# Patient Record
Sex: Male | Born: 2000 | Race: White | Hispanic: No | Marital: Single | Smoking: Never smoker
Health system: Southern US, Community
[De-identification: ages and names within clinical notes are randomized; demographics above are authoritative.]

## PROBLEM LIST (undated history)

## (undated) DIAGNOSIS — J309 Allergic rhinitis, unspecified: Secondary | ICD-10-CM

## (undated) DIAGNOSIS — J45909 Unspecified asthma, uncomplicated: Secondary | ICD-10-CM

## (undated) HISTORY — DX: Allergic rhinitis, unspecified: J30.9

## (undated) HISTORY — PX: ANTERIOR CRUCIATE LIGAMENT REPAIR: SHX115

## (undated) HISTORY — DX: Unspecified asthma, uncomplicated: J45.909

---
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
## 2020-02-07 ENCOUNTER — Encounter: Payer: Self-pay | Admitting: Family Medicine

## 2020-02-07 ENCOUNTER — Other Ambulatory Visit: Payer: Self-pay

## 2020-02-07 ENCOUNTER — Ambulatory Visit (INDEPENDENT_AMBULATORY_CARE_PROVIDER_SITE_OTHER): Payer: Managed Care, Other (non HMO) | Admitting: Family Medicine

## 2020-02-07 VITALS — BP 122/72 | HR 55 | Ht 71.0 in | Wt 160.0 lb

## 2020-02-07 DIAGNOSIS — M545 Low back pain, unspecified: Secondary | ICD-10-CM

## 2020-02-07 DIAGNOSIS — M999 Biomechanical lesion, unspecified: Secondary | ICD-10-CM | POA: Diagnosis not present

## 2020-02-07 NOTE — Progress Notes (Signed)
[name] [address] [place] [address] Phone: [phone] Subjective:   Carl [name], am serving as a scribe for Dr. Antoine Primas. This visit occurred during the SARS-CoV-2 public health emergency.  Safety protocols were in place, including screening questions prior to the visit, additional usage of staff PPE, and extensive cleaning of exam room while observing appropriate contact time as indicated for disinfecting solutions.   I'm seeing this patient by the request  of:  [name], MD  CC: Low back pain upper back pain follow-up  [id number]  Carl [name] is a 19 y.o. male coming in with complaint of back and neck pain. Last seen on 12/25/2019 for OMT. Patient states overall doing relatively well. Has some tightness recently. Patient has been doing the pericardium and peroneal) finished.  Medications patient has been prescribed: Meloxicam, and Zanaflex  Taking: Yes taking the above         Reviewed prior external information including notes and imaging from previsou exam, outside providers and external EMR if available.   As well as notes that were available from care everywhere and other healthcare systems.  Past medical history, social, surgical and family history all reviewed in electronic medical record.  No pertanent information unless stated regarding to the chief complaint.   No past medical history on file.  Allergies  Allergen Reactions  . Other     Other reaction(s): Unknown     Review of Systems:  No headache, visual changes, nausea, vomiting, diarrhea, constipation, dizziness, abdominal pain, skin rash, fevers, chills, night sweats, weight loss, swollen lymph nodes, body aches, joint swelling, chest pain, shortness of breath, mood changes. POSITIVE muscle aches  Objective  Blood pressure 122/72, pulse (!) 55, height 5\' 11"  (1.803 m), weight 160 lb (72.6 kg), SpO2 97 %.   General: No apparent distress  alert and oriented x3 mood and affect normal, dressed appropriately.  HEENT: Pupils equal, extraocular movements intact  Respiratory: Patient's speak in full sentences and does not appear short of breath  Cardiovascular: No lower extremity edema, non tender, no erythema  Neuro: Cranial nerves II through XII are intact, neurovascularly intact in all extremities with 2+ DTRs and 2+ pulses.  Gait normal with good balance and coordination.  MSK:  Non tender with full range of motion and good stability and symmetric strength and tone of shoulders, elbows, wrist, hip, knee and ankles bilaterally.  Back tightness noted in the parascapular region right greater than left. Patient also tightness in the thoracolumbar juncture. Patient still has good range of motion. Actually some mild hypomobility noted. Negative straight leg test. Mild increase in discomfort with extension of the back  Osteopathic findings   T3 extended rotated and side bent right inhaled rib T7 extended rotated and side bent left L2 flexed rotated and side bent right Sacrum right on right       Assessment and Plan: Low back pain-chronic problem, seems mild stiffness, mild exacerbation. Discussed medication management including the meloxicam medication still taking burst, icing regimen. Follow-up again in 6-8 weeks   Nonallopathic problems  Decision today to treat with OMT was based on Physical Exam  After verbal consent patient was treated with HVLA, ME, FPR techniques in cervical, rib, thoracic, lumbar, and sacral  areas  Patient tolerated the procedure well with improvement in symptoms  Patient given exercises, stretches and lifestyle modifications  See medications in patient instructions if given  Patient will follow up in6-8weeks  The above documentation has been reviewed and is accurate and complete Lyndal Pulley, DO       Note: This dictation was prepared with Dragon dictation along with smaller  phrase technology. Any transcriptional errors that result from this process are unintentional.

## 2020-02-07 NOTE — Patient Instructions (Signed)
See me again in 7-8 weeks Congrats!

## 2020-03-31 ENCOUNTER — Other Ambulatory Visit: Payer: Self-pay

## 2020-03-31 ENCOUNTER — Encounter: Payer: Self-pay | Admitting: Family Medicine

## 2020-03-31 ENCOUNTER — Ambulatory Visit (INDEPENDENT_AMBULATORY_CARE_PROVIDER_SITE_OTHER): Payer: Managed Care, Other (non HMO) | Admitting: Family Medicine

## 2020-03-31 VITALS — BP 110/70 | HR 73 | Ht 71.0 in | Wt 158.0 lb

## 2020-03-31 DIAGNOSIS — M545 Low back pain, unspecified: Secondary | ICD-10-CM

## 2020-03-31 DIAGNOSIS — M999 Biomechanical lesion, unspecified: Secondary | ICD-10-CM

## 2020-03-31 DIAGNOSIS — M79644 Pain in right finger(s): Secondary | ICD-10-CM | POA: Diagnosis not present

## 2020-03-31 NOTE — Patient Instructions (Signed)
Wear brace: Day and night one week nightly 2 weeks See me in 6 weeks

## 2020-03-31 NOTE — Progress Notes (Signed)
[name] [address] [place] [address] Phone: [phone] Subjective:   Carl [name], am serving as a scribe for Dr. Antoine Primas. This visit occurred during the SARS-CoV-2 public health emergency.  Safety protocols were in place, including screening questions prior to the visit, additional usage of staff PPE, and extensive cleaning of exam room while observing appropriate contact time as indicated for disinfecting solutions.  I'm seeing this patient by the request  of:  [name], MD  CC: Low back exam follow-up  [id number]  Carl [name] is a 19 y.o. male coming in with complaint of back and neck pain. OMT 02/07/2020. Patient states that his back pain has not changed.   Patient having right CMC joint pain. Believes he hit his hand on something 2 weeks ago.  Denies any decrease in grip strength.  States it is more uncomfortable with certain movements.  Medications patient has been prescribed: Meloxicam, Zanaflex and vitamin D  Taking: Yes         Reviewed prior external information including notes and imaging from previsou exam, outside providers and external EMR if available.   As well as notes that were available from care everywhere and other healthcare systems.  Past medical history, social, surgical and family history all reviewed in electronic medical record.  No pertanent information unless stated regarding to the chief complaint.   No past medical history on file.  Allergies  Allergen Reactions  . Other     Other reaction(s): Unknown     Review of Systems:  No headache, visual changes, nausea, vomiting, diarrhea, constipation, dizziness, abdominal pain, skin rash, fevers, chills, night sweats, weight loss, swollen lymph nodes, body aches, joint swelling, chest pain, shortness of breath, mood changes. POSITIVE muscle aches  Objective  Blood pressure 110/70, pulse 73, height 5\' 11"  (1.803 m), weight 158 lb  (71.7 kg), SpO2 97 %.   General: No apparent distress alert and oriented x3 mood and affect normal, dressed appropriately.  HEENT: Pupils equal, extraocular movements intact  Respiratory: Patient's speak in full sentences and does not appear short of breath  Cardiovascular: No lower extremity edema, non tender, no erythema  Neuro: Cranial nerves II through XII are intact, neurovascularly intact in all extremities with 2+ DTRs and 2+ pulses.  Gait normal with good balance and coordination.  MSK:  Non tender with full range of motion and good stability and symmetric strength and tone of shoulders, elbows, wrist, hip, knee and ankles bilaterally.  Back -low back exam does have some mild tightness noted in the paraspinal musculature.  Patient does have tightness with FABER test bilaterally.  Negative straight leg test.  Tender to palpation diffusely in the paraspinal musculature of the lumbar spine near full range of motion  Right CMC joint does have some mild tenderness.  UCL appears to be intact with good endpoint with stressing.  Patient has some mild pain that is positive with Finkelstein's.  No swelling noted.  Osteopathic findings  T7 extended rotated and side bent left L2 flexed rotated and side bent right Sacrum right on right       Assessment and Plan:  Acute bilateral low back pain without sciatica Mild exacerbation.  Discussed medication including side effects.  Patient is to try to change different positions when he is doing some standing.  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  Which activities could be continuing to give him some exacerbations.  Follow-up again  in 4 to 8 weeks  Pain of right thumb Likely sprain.  UCL appears to be intact.  Negative grind test.  Thumb spica splint given to wear day and night for 1 week and nightly for 2 weeks.  Patient will use as appropriately.  Follow-up again in 6 weeks    Nonallopathic problems  Decision today to treat  with OMT was based on Physical Exam  After verbal consent patient was treated with HVLA, ME, FPR techniques in thoracic, lumbar, and sacral  areas  Patient tolerated the procedure well with improvement in symptoms  Patient given exercises, stretches and lifestyle modifications  See medications in patient instructions if given  Patient will follow up in 4-8 weeks      The above documentation has been reviewed and is accurate and complete Judi Saa, DO       Note: This dictation was prepared with Dragon dictation along with smaller phrase technology. Any transcriptional errors that result from this process are unintentional.

## 2020-03-31 NOTE — Assessment & Plan Note (Signed)
Likely sprain.  UCL appears to be intact.  Negative grind test.  Thumb spica splint given to wear day and night for 1 week and nightly for 2 weeks.  Patient will use as appropriately.  Follow-up again in 6 weeks

## 2020-03-31 NOTE — Assessment & Plan Note (Signed)
Mild exacerbation.  Discussed medication including side effects.  Patient is to try to change different positions when he is doing some standing.  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  Which activities could be continuing to give him some exacerbations.  Follow-up again in 4 to 8 weeks

## 2020-05-13 NOTE — Progress Notes (Signed)
[name] [address] [place] [address] Phone: [phone] Subjective:   Carl [name], am serving as a scribe for Dr. Antoine Primas. This visit occurred during the SARS-CoV-2 public health emergency.  Safety protocols were in place, including screening questions prior to the visit, additional usage of staff PPE, and extensive cleaning of exam room while observing appropriate contact time as indicated for disinfecting solutions.   I'm seeing this patient by the request  of:  [name], MD  CC: Low back pain follow-up  [id number]  Kaide Gage is a 19 y.o. male coming in with complaint of back and neck pain. OMT 03/31/2020. Patient states that overall doing relatively well.  Some mild tightness, nothing severe though.  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  Patient is excited  Medications patient has been prescribed: Vit D, Zanaflex, Meloxicam  Taking: Yes         Reviewed prior external information including notes and imaging from previsou exam, outside providers and external EMR if available.   As well as notes that were available from care everywhere and other healthcare systems.  Past medical history, social, surgical and family history all reviewed in electronic medical record.  No pertanent information unless stated regarding to the chief complaint.   No past medical history on file.  Allergies  Allergen Reactions   Other     Other reaction(s): Unknown     Review of Systems:  No headache, visual changes, nausea, vomiting, diarrhea, constipation, dizziness, abdominal pain, skin rash, fevers, chills, night sweats, weight loss, swollen lymph nodes, body aches, joint swelling, chest pain, shortness of breath, mood changes. POSITIVE muscle aches  Objective  Blood pressure 116/82, pulse 64, height 5\' 11"  (1.803 m), weight 162 lb (73.5 kg), SpO2 97 %.    General: No apparent distress alert and oriented x3 mood and affect normal, dressed appropriately.  HEENT: Pupils equal, extraocular movements intact  Respiratory: Patient's speak in full sentences and does not appear short of breath  Cardiovascular: No lower extremity edema, non tender, no erythema  Neuro: Cranial nerves II through XII are intact, neurovascularly intact in all extremities with 2+ DTRs and 2+ pulses.  Gait normal with good balance and coordination.  MSK:  Non tender with full range of motion and good stability and symmetric strength and tone of shoulders, elbows, wrist, hip, knee and ankles bilaterally.  Hypermobility noted Back -low back does have some mild tightness noted, seems to be more in the thoracolumbar and lumbosacral areas.  Seems to be more right greater than left.  Negative straight leg test, mild tightness with on the right greater than left.  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  Discussed continuing to work out I do believe patient is making improvement and will follow up in 8 weeks    Nonallopathic problems  Decision today to treat with OMT was based on Physical Exam  After verbal consent patient was treated with HVLA, ME, FPR techniques in  thoracic, lumbar, and sacral  areas  Patient tolerated the procedure  well with improvement in symptoms  Patient given exercises, stretches and lifestyle modifications  See medications in patient instructions if given  Patient will follow up in 4-8 weeks      The above documentation has been reviewed and is accurate and complete Judi Saa, DO       Note: This  dictation was prepared with Dragon dictation along with smaller phrase technology. Any transcriptional errors that result from this process are unintentional.

## 2020-05-14 ENCOUNTER — Other Ambulatory Visit: Payer: Self-pay

## 2020-05-14 ENCOUNTER — Encounter: Payer: Self-pay | Admitting: Family Medicine

## 2020-05-14 ENCOUNTER — Ambulatory Visit (INDEPENDENT_AMBULATORY_CARE_PROVIDER_SITE_OTHER): Payer: Managed Care, Other (non HMO) | Admitting: Family Medicine

## 2020-05-14 VITALS — BP 116/82 | HR 64 | Ht 71.0 in | Wt 162.0 lb

## 2020-05-14 DIAGNOSIS — M545 Low back pain, unspecified: Secondary | ICD-10-CM

## 2020-05-14 DIAGNOSIS — M999 Biomechanical lesion, unspecified: Secondary | ICD-10-CM

## 2020-05-14 NOTE — Assessment & Plan Note (Signed)
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  Discussed continuing to work out I do believe patient is making improvement and will follow up in 8 weeks

## 2020-05-14 NOTE — Patient Instructions (Signed)
See me in 8-10 weeks 

## 2020-07-09 NOTE — Progress Notes (Signed)
[name] [address] [place] [address] Phone: [phone] Subjective:   I Carl [name] am serving as a Neurosurgeon for Dr. Antoine Primas.  This visit occurred during the SARS-CoV-2 public health emergency.  Safety protocols were in place, including screening questions prior to the visit, additional usage of staff PPE, and extensive cleaning of exam room while observing appropriate contact time as indicated for disinfecting solutions.   I'm seeing this patient by the request  of:  [name], MD  CC: Back and neck pain follow-up  [id number]  Jayvyn Haselton is a 19 y.o. male coming in with complaint of back and neck pain. OMT 05/14/2020. Patient states he is doing well.   Medications patient has been prescribed: Zanaflex, Vit D          Reviewed prior external information including notes and imaging from previsou exam, outside providers and external EMR if available.   As well as notes that were available from care everywhere and other healthcare systems.  Past medical history, social, surgical and family history all reviewed in electronic medical record.  No pertanent information unless stated regarding to the chief complaint.   No past medical history on file.  Allergies  Allergen Reactions  . Other     Other reaction(s): Unknown     Review of Systems:  No headache, visual changes, nausea, vomiting, diarrhea, constipation, dizziness, abdominal pain, skin rash, fevers, chills, night sweats, weight loss, swollen lymph nodes, body aches, joint swelling, chest pain, shortness of breath, mood changes. POSITIVE muscle aches  Objective  Blood pressure 120/70, pulse 73, height 5\' 11"  (1.803 m), weight 165 lb (74.8 kg), SpO2 98 %.   General: No apparent distress alert and oriented x3 mood and affect normal, dressed appropriately.  HEENT: Pupils equal, extraocular movements intact  Respiratory: Patient's speak in full sentences and  does not appear short of breath  Cardiovascular: No lower extremity edema, non tender, no erythema  Neuro: Cranial nerves II through XII are intact, neurovascularly intact in all extremities with 2+ DTRs and 2+ pulses.  Gait normal with good balance and coordination.  MSK:  Non tender with full range of motion and good stability and symmetric strength and tone of shoulders, elbows, wrist, hip, knee and ankles bilaterally.  Back -neck exam does have some very mild loss of lordosis.  Mild increase in pain with extension.  Patient has mild tightness of the on the right compared to left  Osteopathic findings   T6 extended rotated and side bent left L4 flexed rotated and side bent left Sacrum right on right       Assessment and Plan:  Acute bilateral low back pain without sciatica Continues to have some mild low back pain.  Does have the meloxicam.  Patient though seems to be stable.  Muscle relaxer for breakthrough.  Patient is still doing school as well as working.  Nothing that is stopping him from activity.  We will space out to 2 to 58-month intervals    Nonallopathic problems  Decision today to treat with OMT was based on Physical Exam  After verbal consent patient was treated with HVLA, ME, FPR techniques inthoracic, lumbar, and sacral  areas  Patient tolerated the procedure well with improvement in symptoms  Patient given exercises, stretches and lifestyle modifications  See medications in patient instructions if given  Patient will follow up in 4-8 weeks      The above documentation has been reviewed  and is accurate and complete Lyndal Pulley, DO       Note: This dictation was prepared with Dragon dictation along with smaller phrase technology. Any transcriptional errors that result from this process are unintentional.

## 2020-07-10 ENCOUNTER — Encounter: Payer: Self-pay | Admitting: Family Medicine

## 2020-07-10 ENCOUNTER — Other Ambulatory Visit: Payer: Self-pay

## 2020-07-10 ENCOUNTER — Ambulatory Visit (INDEPENDENT_AMBULATORY_CARE_PROVIDER_SITE_OTHER): Payer: Managed Care, Other (non HMO) | Admitting: Family Medicine

## 2020-07-10 VITALS — BP 120/70 | HR 73 | Ht 71.0 in | Wt 165.0 lb

## 2020-07-10 DIAGNOSIS — M545 Low back pain, unspecified: Secondary | ICD-10-CM | POA: Diagnosis not present

## 2020-07-10 DIAGNOSIS — M999 Biomechanical lesion, unspecified: Secondary | ICD-10-CM | POA: Diagnosis not present

## 2020-07-10 NOTE — Patient Instructions (Addendum)
Good to see you Doing great don't change anything See me again in 7-8 weeks

## 2020-07-10 NOTE — Assessment & Plan Note (Signed)
Continues to have some mild low back pain.  Does have the meloxicam.  Patient though seems to be stable.  Muscle relaxer for breakthrough.  Patient is still doing school as well as working.  Nothing that is stopping him from activity.  We will space out to 2 to 51-month intervals

## 2020-09-03 NOTE — Progress Notes (Signed)
[name] [address] [place] [address] Phone: [phone] Subjective:   Carl [name], am serving as a scribe for Dr. Antoine Primas. This visit occurred during the SARS-CoV-2 public health emergency.  Safety protocols were in place, including screening questions prior to the visit, additional usage of staff PPE, and extensive cleaning of exam room while observing appropriate contact time as indicated for disinfecting solutions.   I'm seeing this patient by the request  of:  [name], MD  CC: Back pain follow-up  [id number]  Carl [name] is a 19 y.o. male coming in with complaint of back pain. 07/10/2020. Patient states that he has been doing well since last visit.  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  No new symptoms Medications patient has been prescribed: None but has been prescribed meloxicam and Zanaflex in the past          Reviewed prior external information including notes and imaging from previsou exam, outside providers and external EMR if available.   As well as notes that were available from care everywhere and other healthcare systems.  Past medical history, social, surgical and family history all reviewed in electronic medical record.  No pertanent information unless stated regarding to the chief complaint.   No past medical history on file.  Allergies  Allergen Reactions  . Other     Other reaction(s): Unknown     Review of Systems:  No headache, visual changes, nausea, vomiting, diarrhea, constipation, dizziness, abdominal pain, skin rash, fevers, chills, night sweats, weight loss, swollen lymph nodes, body aches, joint swelling, chest pain, shortness of breath, mood changes. POSITIVE muscle aches  Objective  Blood pressure 118/88, pulse 72, height 5\' 11"  (1.803 m), weight 163 lb (73.9 kg), SpO2 97 %.   General: No  apparent distress alert and oriented x3 mood and affect normal, dressed appropriately.  HEENT: Pupils equal, extraocular movements intact  Respiratory: Patient's speak in full sentences and does not appear short of breath  Cardiovascular: No lower extremity edema, non tender, no erythema  Neuro: Cranial nerves II through XII are intact, neurovascularly intact in all extremities with 2+ DTRs and 2+ pulses.  Gait normal with good balance and coordination.  MSK:  Non tender with full range of motion and good stability and symmetric strength and tone of shoulders, elbows, wrist, hip, knee and ankles bilaterally.  Back -low back exam does have some mild loss of lordosis.  Some tightness noted in the paraspinal musculature.  Patient has mild tightness with right greater than left.  Osteopathic findings  T8 extended rotated and side bent right L4 flexed rotated and side bent left Sacrum right on right       Assessment and Plan:  Acute bilateral low back pain without sciatica Patient seems to be doing somewhat better.  Not having as much pain.  Patient is no longer studying as frequently as he was doing previously.  Patient does have the muscle relaxer and the meloxicam for any breakthrough pain.  Discussed posture and ergonomics.  Follow-up with me again  8 weeks    Nonallopathic problems  Decision today to treat with OMT was based on Physical Exam  After verbal consent patient was treated with HVLA, ME, FPR techniques in  thoracic, lumbar, and sacral  areas  Patient tolerated the procedure well with improvement in symptoms  Patient  given exercises, stretches and lifestyle modifications  See medications in patient instructions if given  Patient will follow up in 4-8 weeks      The above documentation has been reviewed and is accurate and complete Judi Saa, DO       Note: This dictation was prepared with Dragon dictation along with smaller phrase technology. Any  transcriptional errors that result from this process are unintentional.

## 2020-09-04 ENCOUNTER — Encounter: Payer: Self-pay | Admitting: Family Medicine

## 2020-09-04 ENCOUNTER — Other Ambulatory Visit: Payer: Self-pay

## 2020-09-04 ENCOUNTER — Ambulatory Visit (INDEPENDENT_AMBULATORY_CARE_PROVIDER_SITE_OTHER): Payer: Managed Care, Other (non HMO) | Admitting: Family Medicine

## 2020-09-04 VITALS — BP 118/88 | HR 72 | Ht 71.0 in | Wt 163.0 lb

## 2020-09-04 DIAGNOSIS — M999 Biomechanical lesion, unspecified: Secondary | ICD-10-CM

## 2020-09-04 DIAGNOSIS — M545 Low back pain, unspecified: Secondary | ICD-10-CM

## 2020-09-04 NOTE — Assessment & Plan Note (Signed)
Patient seems to be doing somewhat better.  Not having as much pain.  Patient is no longer studying as frequently as he was doing previously.  Patient does have the muscle relaxer and the meloxicam for any breakthrough pain.  Discussed posture and ergonomics.  Follow-up with me again  8 weeks

## 2020-09-04 NOTE — Patient Instructions (Signed)
Doing great Enjoy the beach Happy New Year Congrats See me in 2 months

## 2020-11-04 NOTE — Progress Notes (Signed)
[name] [address] [place] [address] Phone: [phone] Subjective:   Carl [name], am serving as a scribe for Dr. Antoine Primas. This visit occurred during the SARS-CoV-2 public health emergency.  Safety protocols were in place, including screening questions prior to the visit, additional usage of staff PPE, and extensive cleaning of exam room while observing appropriate contact time as indicated for disinfecting solutions.   I'm seeing this patient by the request  of:  [name], MD  CC: Low back pain follow-up, continued right wrist pain  [id number]  Carl [name] is a 20 y.o. male coming in with complaint of back and neck pain. OMT 09/04/2020. Patient states that his back has been good.   Patient continues to have wrist pain over palmer aspect especially with opening jars or doors.  Patient states that it is intermittent.  Not stopping him from activity but is frustrating.  We did see patient back in July patient had what appeared to be a UCL sprain but has not had any problems since then.  Points to more at the base of the [facility] of where the pain is in the wrist crease  Medications patient has been prescribed: Not taking any of the meloxicam or Zanaflex         Reviewed prior external information including notes and imaging from previsou exam, outside providers and external EMR if available.   As well as notes that were available from care everywhere and other healthcare systems.  Past medical history, social, surgical and family history all reviewed in electronic medical record.  No pertanent information unless stated regarding to the chief complaint.   No past medical history on file.  Allergies  Allergen Reactions  . Other     Other reaction(s): Unknown     Review of Systems:  No headache, visual changes, nausea, vomiting, diarrhea, constipation, dizziness, abdominal pain, skin rash, fevers, chills, night sweats,  weight loss, swollen lymph nodes, body aches, joint swelling, chest pain, shortness of breath, mood changes. POSITIVE muscle aches mild and improving  Objective  Blood pressure 110/82, pulse 62, height 5\' 11"  (1.803 m), weight 166 lb (75.3 kg), SpO2 99 %.   General: No apparent distress alert and oriented x3 mood and affect normal, dressed appropriately.  HEENT: Pupils equal, extraocular movements intact  Respiratory: Patient's speak in full sentences and does not appear short of breath  Cardiovascular: No lower extremity edema, non tender, no erythema   Gait normal with good balance and coordination.  MSK: Hypermobility noted in multiple joints Right wrist does show that patient CMC does have very minimal tenderness.  Some mild pain over the scaphoid lunate area.  Patient has full range of motion.  UCL appears to be intact but once again negative grind test Patient's low back does have some very mild tightness in the paraspinal musculature.  Negative's straight leg test, negative FABER test.  Great range of motion of the back.  Osteopathic findings  T6 extended rotated and side bent left L2 flexed rotated and side bent right Sacrum right on right       Assessment and Plan:  Pain of right thumb Pain seems to be more on the [facility] joint than it was previously.  Patient's UCL appears to be intact but patient does have some mild hypermobility.  States that the pain medicine lunate aspect but no subluxation appreciated today.  Discussed potential taping with repetitive activity.  X-rays are pending.  Follow-up  with me again in 2 months.  Acute bilateral low back pain without sciatica Back pain is doing very well at this time.  Encourage patient to continue to stay active.  Discussed icing regimen and home exercises.  Increase activity slowly.  Patient has done remarkably well.  Not taking any of the prescribed medications but we did discuss the Zanaflex as well as the meloxicam.  Patient will  follow up with me again in 2 to 3 months    Nonallopathic problems  Decision today to treat with OMT was based on Physical Exam  After verbal consent patient was treated with HVLA, ME, FPR techniques in  thoracic, lumbar, and sacral  areas  Patient tolerated the procedure well with improvement in symptoms  Patient given exercises, stretches and lifestyle modifications  See medications in patient instructions if given  Patient will follow up in 4-8 weeks      The above documentation has been reviewed and is accurate and complete Judi Saa, DO       Note: This dictation was prepared with Dragon dictation along with smaller phrase technology. Any transcriptional errors that result from this process are unintentional.

## 2020-11-05 ENCOUNTER — Ambulatory Visit (INDEPENDENT_AMBULATORY_CARE_PROVIDER_SITE_OTHER): Payer: Managed Care, Other (non HMO) | Admitting: Family Medicine

## 2020-11-05 ENCOUNTER — Ambulatory Visit (INDEPENDENT_AMBULATORY_CARE_PROVIDER_SITE_OTHER): Payer: Managed Care, Other (non HMO)

## 2020-11-05 ENCOUNTER — Encounter: Payer: Self-pay | Admitting: Family Medicine

## 2020-11-05 ENCOUNTER — Other Ambulatory Visit: Payer: Self-pay

## 2020-11-05 VITALS — BP 110/82 | HR 62 | Ht 71.0 in | Wt 166.0 lb

## 2020-11-05 DIAGNOSIS — M545 Low back pain, unspecified: Secondary | ICD-10-CM | POA: Diagnosis not present

## 2020-11-05 DIAGNOSIS — M79644 Pain in right finger(s): Secondary | ICD-10-CM

## 2020-11-05 DIAGNOSIS — M999 Biomechanical lesion, unspecified: Secondary | ICD-10-CM | POA: Diagnosis not present

## 2020-11-05 DIAGNOSIS — M25531 Pain in right wrist: Secondary | ICD-10-CM | POA: Diagnosis not present

## 2020-11-05 NOTE — Assessment & Plan Note (Signed)
Pain seems to be more on the [facility] joint than it was previously.  Patient's UCL appears to be intact but patient does have some mild hypermobility.  States that the pain medicine lunate aspect but no subluxation appreciated today.  Discussed potential taping with repetitive activity.  X-rays are pending.  Follow-up with me again in 2 months.

## 2020-11-05 NOTE — Assessment & Plan Note (Signed)
Back pain is doing very well at this time.  Encourage patient to continue to stay active.  Discussed icing regimen and home exercises.  Increase activity slowly.  Patient has done remarkably well.  Not taking any of the prescribed medications but we did discuss the Zanaflex as well as the meloxicam.  Patient will follow up with me again in 2 to 3 months

## 2020-11-05 NOTE — Patient Instructions (Signed)
Xray of wrist today Tape with activity  Ice after activity See me again in 2-3 months

## 2021-01-12 NOTE — Progress Notes (Signed)
[name] [address] [place] [address] Phone: [phone] Subjective:   Carl [name], am serving as a scribe for Dr. Antoine Primas. This visit occurred during the SARS-CoV-2 public health emergency.  Safety protocols were in place, including screening questions prior to the visit, additional usage of staff PPE, and extensive cleaning of exam room while observing appropriate contact time as indicated for disinfecting solutions.   I'm seeing this patient by the request  of:  [name], MD  CC:    [id number]   11/05/2020 Pain of right thumb Pain seems to be more on the [facility] joint than it was previously.  Patient's UCL appears to be intact but patient does have some mild hypermobility.  States that the pain medicine lunate aspect but no subluxation appreciated today.  Discussed potential taping with repetitive activity.  X-rays are pending.  Follow-up with me again in 2 months.  Update 01/13/2021 Carl [name] is a 20 y.o. male coming in with complaint of R thumb and LBP. OMT last visit. States that his wrist pain is more of a stiffness and dull achy discomfort which is improvement. Back pain is the same as last visit.   trying to stay active.       No past medical history on file. No past surgical history on file. Social History   Socioeconomic History  . Marital status: Single    Spouse name: Not on file  . Number of children: Not on file  . Years of education: Not on file  . Highest education level: Not on file  Occupational History  . Not on file  Tobacco Use  . Smoking status: Never Smoker  . Smokeless tobacco: Never Used  Substance and Sexual Activity  . Alcohol use: Not on file  . Drug use: Not on file  . Sexual activity: Not on file  Other Topics Concern  . Not on file  Social History Narrative  . Not on file   Social Determinants of Health   Financial Resource Strain: Not on file  Food Insecurity: Not on file   Transportation Needs: Not on file  Physical Activity: Not on file  Stress: Not on file  Social Connections: Not on file   Allergies  Allergen Reactions  . Other     Other reaction(s): Unknown   No family history on file.  Current Outpatient Medications (Endocrine & Metabolic):  .  predniSONE (DELTASONE) 50 MG tablet, Take one tablet daily for the next 5 days.   Current Outpatient Medications (Respiratory):  .  fexofenadine-pseudoephedrine (ALLEGRA-D 24) 180-240 MG 24 hr tablet, Take 1 tablet by mouth daily. .  montelukast (SINGULAIR) 10 MG tablet, Take 10 mg by mouth at bedtime.  Current Outpatient Medications (Analgesics):  .  ibuprofen (ADVIL,MOTRIN) 200 MG tablet, Take 200 mg by mouth every 6 (six) hours as needed. .  meloxicam (MOBIC) 15 MG tablet, Take 1 tablet (15 mg total) by mouth daily.   Current Outpatient Medications (Other):  .  cephALEXin (KEFLEX) 500 MG capsule, Take 1 capsule (500 mg total) by mouth 2 (two) times daily. Marland Kitchen  tiZANidine (ZANAFLEX) 4 MG tablet, Take 1 tablet (4 mg total) by mouth at bedtime. .  Vitamin D, Ergocalciferol, (DRISDOL) 50000 units CAPS capsule, Take 1 capsule (50,000 Units total) by mouth every 7 (seven) days.   Reviewed prior external information including notes and imaging from  primary care provider As well as notes that were available from care everywhere  and other healthcare systems.  Past medical history, social, surgical and family history all reviewed in electronic medical record.  No pertanent information unless stated regarding to the chief complaint.   Review of Systems:  No headache, visual changes, nausea, vomiting, diarrhea, constipation, dizziness, abdominal pain, skin rash, fevers, chills, night sweats, weight loss, swollen lymph nodes,  joint swelling, chest pain, shortness of breath, mood changes. POSITIVE muscle aches, body aches   Objective  Blood pressure 122/88, pulse 84, height 5\' 11"  (1.803 m), weight 173 lb  (78.5 kg), SpO2 99 %.   General: No apparent distress alert and oriented x3 mood and affect normal, dressed appropriately.  HEENT: Pupils equal, extraocular movements intact  Respiratory: Patient's speak in full sentences and does not appear short of breath  Cardiovascular: No lower extremity edema, non tender, no erythema  Gait normal with good balance and coordination. Right ucl mild pain on dynamic testing but end point noted.   MSK:  Low back does have TTP mostly right SI joint  5/5 strength  Faber tight   Ltd MSK  Right thumb does have some increase vascularity near the UCL, but no true gapping on dynamic testing     Osteopathic findings T8 extended rotated and side bent left L2 flexed rotated and side bent right Sacrum right on right  Impression and Recommendations:     The above documentation has been reviewed and is accurate and complete , DO

## 2021-01-13 ENCOUNTER — Other Ambulatory Visit: Payer: Self-pay

## 2021-01-13 ENCOUNTER — Encounter: Payer: Self-pay | Admitting: Family Medicine

## 2021-01-13 ENCOUNTER — Ambulatory Visit (INDEPENDENT_AMBULATORY_CARE_PROVIDER_SITE_OTHER): Payer: Managed Care, Other (non HMO) | Admitting: Family Medicine

## 2021-01-13 ENCOUNTER — Ambulatory Visit: Payer: Self-pay

## 2021-01-13 VITALS — BP 122/88 | HR 84 | Ht 71.0 in | Wt 173.0 lb

## 2021-01-13 DIAGNOSIS — M999 Biomechanical lesion, unspecified: Secondary | ICD-10-CM

## 2021-01-13 DIAGNOSIS — G8929 Other chronic pain: Secondary | ICD-10-CM

## 2021-01-13 DIAGNOSIS — M79644 Pain in right finger(s): Secondary | ICD-10-CM

## 2021-01-13 NOTE — Assessment & Plan Note (Signed)
Patient states improvement, will monitor, patient declined imaging.  RTC in 8-10 weeks

## 2021-01-13 NOTE — Patient Instructions (Signed)
Good to see you Overall doing well Watch the thumb See me in 8-10 weeks

## 2021-01-13 NOTE — Assessment & Plan Note (Signed)
Decision today to treat with OMT was based on Physical Exam  After verbal consent patient was treated with HVLA techniques in Thoracic, lumbar and sacral  areas  Patient tolerated the procedure well with improvement in symptoms  Patient given exercises, stretches and lifestyle modifications  See medications in patient instructions if given  Patient will follow up in 12 weeks

## 2021-03-12 NOTE — Progress Notes (Signed)
[name] [address] [place] [address] Phone: [phone] Subjective:   Carl [name], am serving as a scribe for Dr. Antoine Primas.  This visit occurred during the SARS-CoV-2 public health emergency.  Safety protocols were in place, including screening questions prior to the visit, additional usage of staff PPE, and extensive cleaning of exam room while observing appropriate contact time as indicated for disinfecting solutions.    I'm seeing this patient by the request  of:  Carl Res, MD  CC: Back and neck pain follow-up  [id number]  Carl [name] is a 20 y.o. male coming in with complaint of back and neck pain. OMT 01/13/2021. Also seen for R thumb pain. Patient states that his back is doing ok. Pain in thumb continues in [facility] joint intermittently. Pain especially when grasping heavy items.  Patient states that this comes and goes.  States that the back seems to be a more concerning portion but very minimal overall.  Continuing to stay fairly active.  Medications patient has been prescribed: Meloxicam and Zanaflex  Taking: Very intermittently         Reviewed prior external information including notes and imaging from previsou exam, outside providers and external EMR if available.   As well as notes that were available from care everywhere and other healthcare systems.  Past medical history, social, surgical and family history all reviewed in electronic medical record.  No pertanent information unless stated regarding to the chief complaint.   No past medical history on file.  Allergies  Allergen Reactions   Other     Other reaction(s): Unknown     Review of Systems:  No headache, visual changes, nausea, vomiting, diarrhea, constipation, dizziness, abdominal pain, skin rash, fevers, chills, night sweats, weight loss, swollen lymph nodes, body aches, joint swelling, chest pain, shortness of breath, mood changes.  POSITIVE muscle aches  Objective  Blood pressure 120/80, pulse (!) 59, height 5\' 11"  (1.803 m), weight 168 lb (76.2 kg), SpO2 99 %.   General: No apparent distress alert and oriented x3 mood and affect normal, dressed appropriately.  HEENT: Pupils equal, extraocular movements intact  Respiratory: Patient's speak in full sentences and does not appear short of breath  Cardiovascular: No lower extremity edema, non tender, no erythema  Back exam does have some loss of lordosis.  Some tenderness to palpation of the paraspinal musculature.  Patient does have hypermobility in multiple other joints. Right thumb tender to palpation at the [facility] joint.  Mild laxity but still good UCL compared to the contralateral side.  Tender on exam with good range of motion.  Osteopathic findings  C6 flexed rotated and side bent left T8 extended rotated and side bent right inhaled rib L3 flexed rotated and side bent left  Sacrum right on right   Limited musculoskeletal ultrasound was performed and interpreted by [facility]  Limited ultrasound of patient's right [place] joint shows no significant hypoechoic changes.  UCL appears to be intact but difficult to see with dynamic testing.  Otherwise fairly unremarkable. Impression: Irregular ultrasound of the right thumb    Assessment and Plan: Pain of right thumb Patient continues to have discomfort.  Once again declines any type of imaging.  Patient will continue to monitor.  Follow-up again in 2 months  Acute bilateral low back pain without sciatica Back pain but is responding well to conservative therapy including home exercises, icing regimen, which activities to avoid.  Discussed lifting.  He  does have muscle relaxers as needed for breakthrough he has taken meloxicam.  Follow-up again in 2 months    Nonallopathic problems  Decision today to treat with OMT was based on Physical Exam  After verbal consent patient was treated with HVLA, ME, FPR techniques  in cervical, rib, thoracic, lumbar, and sacral  areas  Patient tolerated the procedure well with improvement in symptoms  Patient given exercises, stretches and lifestyle modifications  See medications in patient instructions if given  Patient will follow up in 4-8 weeks     The above documentation has been reviewed and is accurate and complete Judi Saa, DO        Note: This dictation was prepared with Dragon dictation along with smaller phrase technology. Any transcriptional errors that result from this process are unintentional.

## 2021-03-19 ENCOUNTER — Ambulatory Visit: Payer: Self-pay

## 2021-03-19 ENCOUNTER — Ambulatory Visit (INDEPENDENT_AMBULATORY_CARE_PROVIDER_SITE_OTHER): Payer: BC Managed Care – PPO | Admitting: Family Medicine

## 2021-03-19 ENCOUNTER — Other Ambulatory Visit: Payer: Self-pay

## 2021-03-19 ENCOUNTER — Encounter: Payer: Self-pay | Admitting: Family Medicine

## 2021-03-19 VITALS — BP 120/80 | HR 59 | Ht 71.0 in | Wt 168.0 lb

## 2021-03-19 DIAGNOSIS — M545 Low back pain, unspecified: Secondary | ICD-10-CM | POA: Diagnosis not present

## 2021-03-19 DIAGNOSIS — M9904 Segmental and somatic dysfunction of sacral region: Secondary | ICD-10-CM | POA: Diagnosis not present

## 2021-03-19 DIAGNOSIS — M9902 Segmental and somatic dysfunction of thoracic region: Secondary | ICD-10-CM

## 2021-03-19 DIAGNOSIS — M9903 Segmental and somatic dysfunction of lumbar region: Secondary | ICD-10-CM

## 2021-03-19 DIAGNOSIS — M9908 Segmental and somatic dysfunction of rib cage: Secondary | ICD-10-CM | POA: Diagnosis not present

## 2021-03-19 DIAGNOSIS — M79644 Pain in right finger(s): Secondary | ICD-10-CM | POA: Diagnosis not present

## 2021-03-19 DIAGNOSIS — M9901 Segmental and somatic dysfunction of cervical region: Secondary | ICD-10-CM

## 2021-03-19 NOTE — Assessment & Plan Note (Signed)
Patient continues to have discomfort.  Once again declines any type of imaging.  Patient will continue to monitor.  Follow-up again in 2 months

## 2021-03-19 NOTE — Assessment & Plan Note (Signed)
Back pain but is responding well to conservative therapy including home exercises, icing regimen, which activities to avoid.  Discussed lifting.  He does have muscle relaxers as needed for breakthrough he has taken meloxicam.  Follow-up again in 2 months

## 2021-03-19 NOTE — Patient Instructions (Signed)
Wrap thumb with fishing Watch the thumb for now  See me in 6-8 weeks

## 2021-04-29 NOTE — Progress Notes (Deleted)
  [name] [address] [place] [address] Phone: [phone] Subjective:    I'm seeing this patient by the request  of:  [name], MD  CC:   [id number]  Carl [name] is a 20 y.o. male coming in with complaint of back and neck pain. OMT 03/19/2021. R thumb pain f/u as well. Patient states   Medications patient has been prescribed: None  Taking:         Reviewed prior external information including notes and imaging from previsou exam, outside providers and external EMR if available.   As well as notes that were available from care everywhere and other healthcare systems.  Past medical history, social, surgical and family history all reviewed in electronic medical record.  No pertanent information unless stated regarding to the chief complaint.   No past medical history on file.  Allergies  Allergen Reactions   Other     Other reaction(s): Unknown     Review of Systems:  No headache, visual changes, nausea, vomiting, diarrhea, constipation, dizziness, abdominal pain, skin rash, fevers, chills, night sweats, weight loss, swollen lymph nodes, body aches, joint swelling, chest pain, shortness of breath, mood changes. POSITIVE muscle aches  Objective  There were no vitals taken for this visit.   General: No apparent distress alert and oriented x3 mood and affect normal, dressed appropriately.  HEENT: Pupils equal, extraocular movements intact  Respiratory: Patient's speak in full sentences and does not appear short of breath  Cardiovascular: No lower extremity edema, non tender, no erythema  Neuro: Cranial nerves II through XII are intact, neurovascularly intact in all extremities with 2+ DTRs and 2+ pulses.  Gait normal with good balance and coordination.  MSK:  Non tender with full range of motion and good stability and symmetric strength and tone of shoulders, elbows, wrist, hip, knee and ankles bilaterally.  Back -  Normal skin, Spine with normal alignment and no deformity.  No tenderness to vertebral process palpation.  Paraspinous muscles are not tender and without spasm.   Range of motion is full at neck and lumbar sacral regions  Osteopathic findings  C2 flexed rotated and side bent right C6 flexed rotated and side bent left T3 extended rotated and side bent right inhaled rib T9 extended rotated and side bent left L2 flexed rotated and side bent right Sacrum right on right       Assessment and Plan:    Nonallopathic problems  Decision today to treat with OMT was based on Physical Exam  After verbal consent patient was treated with HVLA, ME, FPR techniques in cervical, rib, thoracic, lumbar, and sacral  areas  Patient tolerated the procedure well with improvement in symptoms  Patient given exercises, stretches and lifestyle modifications  See medications in patient instructions if given  Patient will follow up in 4-8 weeks      The above documentation has been reviewed and is accurate and complete [name]       Note: This dictation was prepared with Dragon dictation along with smaller phrase technology. Any transcriptional errors that result from this process are unintentional.

## 2021-04-30 ENCOUNTER — Ambulatory Visit: Payer: Managed Care, Other (non HMO) | Admitting: Family Medicine

## 2021-11-16 NOTE — Progress Notes (Deleted)
?  Terrilee Files D.O. ?[facility] ?[address] [place] [address] ?Phone: [phone] ?Subjective:   ? ?I'm seeing this patient by the request  of:  [name], MD ? ?CC:  ? ?[id number]  ?Carl [name] is a 21 y.o. male coming in with complaint of back and neck pain. OMT 03/19/2021.  Patient states  ? ?Medications patient has been prescribed: None ? ?Taking: ? ? ?  ? ? ? ? ?Reviewed prior external information including notes and imaging from previsou exam, outside providers and external EMR if available.  ? ?As well as notes that were available from care everywhere and other healthcare systems. ? ?Past medical history, social, surgical and family history all reviewed in electronic medical record.  No pertanent information unless stated regarding to the chief complaint.  ? ?No past medical history on file.  ?Allergies  ?Allergen Reactions  ? Other   ?  Other reaction(s): Unknown  ? ? ? ?Review of Systems: ? No headache, visual changes, nausea, vomiting, diarrhea, constipation, dizziness, abdominal pain, skin rash, fevers, chills, night sweats, weight loss, swollen lymph nodes, body aches, joint swelling, chest pain, shortness of breath, mood changes. POSITIVE muscle aches ? ?Objective  ?There were no vitals taken for this visit. ?  ?General: No apparent distress alert and oriented x3 mood and affect normal, dressed appropriately.  ?HEENT: Pupils equal, extraocular movements intact  ?Respiratory: Patient's speak in full sentences and does not appear short of breath  ?Cardiovascular: No lower extremity edema, non tender, no erythema  ?Neuro: Cranial nerves II through XII are intact, neurovascularly intact in all extremities with 2+ DTRs and 2+ pulses.  ?Gait normal with good balance and coordination.  ?MSK:  Non tender with full range of motion and good stability and symmetric strength and tone of shoulders, elbows, wrist, hip, knee and ankles bilaterally.  ?Back - Normal skin, Spine with  normal alignment and no deformity.  No tenderness to vertebral process palpation.  Paraspinous muscles are not tender and without spasm.   Range of motion is full at neck and lumbar sacral regions ? ?Osteopathic findings ? ?C2 flexed rotated and side bent right ?C6 flexed rotated and side bent left ?T3 extended rotated and side bent right inhaled rib ?T9 extended rotated and side bent left ?L2 flexed rotated and side bent right ?Sacrum right on right ? ? ? ? ?  ?Assessment and Plan: ? ? ? ?Nonallopathic problems ? ?Decision today to treat with OMT was based on Physical Exam ? ?After verbal consent patient was treated with HVLA, ME, FPR techniques in cervical, rib, thoracic, lumbar, and sacral  areas ? ?Patient tolerated the procedure well with improvement in symptoms ? ?Patient given exercises, stretches and lifestyle modifications ? ?See medications in patient instructions if given ? ?Patient will follow up in 4-8 weeks ? ?  ? ? ?The above documentation has been reviewed and is accurate and complete [name] ? ? ?  ? ? Note: This dictation was prepared with Dragon dictation along with smaller phrase technology. Any transcriptional errors that result from this process are unintentional.    ?  ?  ? ?

## 2021-11-19 ENCOUNTER — Ambulatory Visit: Payer: BC Managed Care – PPO | Admitting: Family Medicine

## 2021-12-10 ENCOUNTER — Ambulatory Visit
Admission: RE | Admit: 2021-12-10 | Discharge: 2021-12-10 | Disposition: A | Discharge status: Home / Self Care | Payer: Self-pay | Source: Ambulatory Visit | Attending: Physician Assistant | Admitting: Physician Assistant

## 2021-12-10 ENCOUNTER — Other Ambulatory Visit: Payer: Self-pay | Admitting: Physician Assistant

## 2021-12-10 ENCOUNTER — Ambulatory Visit
Admission: RE | Admit: 2021-12-10 | Discharge: 2021-12-10 | Disposition: A | Discharge status: Home / Self Care | Payer: Self-pay | Attending: Physician Assistant | Admitting: Physician Assistant

## 2021-12-10 DIAGNOSIS — Z0000 Encounter for general adult medical examination without abnormal findings: Secondary | ICD-10-CM

## 2021-12-18 ENCOUNTER — Ambulatory Visit: Payer: Managed Care, Other (non HMO) | Admitting: Sports Medicine

## 2021-12-18 ENCOUNTER — Ambulatory Visit (INDEPENDENT_AMBULATORY_CARE_PROVIDER_SITE_OTHER): Payer: Managed Care, Other (non HMO)

## 2021-12-18 VITALS — BP 110/80 | HR 99 | Ht 71.0 in | Wt 174.0 lb

## 2021-12-18 DIAGNOSIS — S93402A Sprain of unspecified ligament of left ankle, initial encounter: Secondary | ICD-10-CM

## 2021-12-18 DIAGNOSIS — M25572 Pain in left ankle and joints of left foot: Secondary | ICD-10-CM

## 2021-12-18 MED ORDER — MELOXICAM 15 MG PO TABS: 15.0000 mg | ORAL_TABLET | Freq: Every day | ORAL | 0 refills | Status: DC

## 2021-12-18 NOTE — Patient Instructions (Addendum)
Good to see you  ?Lace up ankle brace  ?- Start meloxicam 15 mg daily x2 weeks.  If still having pain after 2 weeks, complete 3rd-week of meloxicam. May use remaining meloxicam as needed once daily for pain control.  Do not to use additional NSAIDs while taking meloxicam.  May use Tylenol [phone] mg 2 to 3 times a day for breakthrough pain. ?RICE therapy  ?ROM ankle HEP  ?As needed follow up 2-3 weeks if no improvement  ?

## 2021-12-18 NOTE — Progress Notes (Signed)
? ? Carl [name] D.Judd Gaudier ?[facility] ?[address] [place] [address] ?Phone: [phone] ?  ?Assessment and Plan:   ?  ?1. Acute left ankle pain ?2. Moderate left ankle sprain, initial encounter ?-Acute, uncomplicated, initial sports medicine visit ?- Suspect a grade 2 versus 3 ATFL sprain based on MOI of hyper extension/inversion of ankle, physical exam ?- Start meloxicam 15 mg daily x2 weeks.  If still having pain after 2 weeks, complete 3rd-week of meloxicam. May use remaining meloxicam as needed once daily for pain control.  Do not to use additional NSAIDs while taking meloxicam.  May use Tylenol [phone] mg 2 to 3 times a day for breakthrough pain. ?- Start RICE therapy ?- Start HEP for ankle ROM ?- Goal of pain-free with ambulation.  Recommend using lace up ankle brace ?- X-ray obtained in clinic.  My interpretation: No acute fracture or dislocation. ? ?- DG Ankle Complete Left; Future  ?  ?Pertinent previous records reviewed include none ?  ?Follow Up: As needed if no improving or worsening symptoms in 2 to 3 weeks ?  ?Subjective:   ?I, Carl [name], am serving as a Neurosurgeon for Doctor [employer] ? ?Chief Complaint: left ankle injury  ? ?HPI:  ? ?12/18/21 ?Patient is a 21 year old male complaining of left ankle pain. Patient states that he was at work and slipped and stepped wrong thinks he hyper-extended his foot felt a pop swelling and it hurts to move it extension and flexion are pain, has been taking tylenol that, little bit of tingling no numbness, pain stays in the ankle, happened yesterday  ? ?Relevant Historical Information: None pertinent ? ?Additional pertinent review of systems negative. ? ? ?Current Outpatient Medications:  ?  cephALEXin (KEFLEX) 500 MG capsule, Take 1 capsule (500 mg total) by mouth 2 (two) times daily., Disp: 14 capsule, Rfl: 0 ?  fexofenadine-pseudoephedrine (ALLEGRA-D 24) 180-240 MG 24 hr tablet, Take 1 tablet by mouth daily., Disp:  , Rfl:  ?  ibuprofen (ADVIL,MOTRIN) 200 MG tablet, Take 200 mg by mouth every 6 (six) hours as needed., Disp: , Rfl:  ?  meloxicam (MOBIC) 15 MG tablet, Take 1 tablet (15 mg total) by mouth daily., Disp: 30 tablet, Rfl: 0 ?  meloxicam (MOBIC) 15 MG tablet, Take 1 tablet (15 mg total) by mouth daily., Disp: 30 tablet, Rfl: 0 ?  montelukast (SINGULAIR) 10 MG tablet, Take 10 mg by mouth at bedtime., Disp: , Rfl:  ?  predniSONE (DELTASONE) 50 MG tablet, Take one tablet daily for the next 5 days., Disp: 5 tablet, Rfl: 0 ?  tiZANidine (ZANAFLEX) 4 MG tablet, Take 1 tablet (4 mg total) by mouth at bedtime., Disp: 20 tablet, Rfl: 0 ?  Vitamin D, Ergocalciferol, (DRISDOL) 50000 units CAPS capsule, Take 1 capsule (50,000 Units total) by mouth every 7 (seven) days., Disp: 12 capsule, Rfl: 0  ? ?Objective:   ?  ?Vitals:  ? 12/18/21 1053  ?BP: 110/80  ?Pulse: 99  ?SpO2: 96%  ?Weight: 174 lb (78.9 kg)  ?Height: 5\' 11"  (1.803 m)  ?  ?  ?Body mass index is 24.27 kg/m?.  ?  ?Physical Exam:   ? ?Gen: Appears well, nad, nontoxic and pleasant ?Psych: Alert and oriented, appropriate mood and affect ?Neuro: sensation intact, strength is 5/5 with df/pf/inv/ev, muscle tone wnl ?Skin: no susupicious lesions or rashes ? ?Left ankle: no deformity,  ?Moderate lateral swelling or effusion ?TTP ATFL, CFL ?NTTP over fibular head, lat  mal, medial mal, achilles, navicular, base of 5th,  deltoid, calcaneous or midfoot ?ROM DF 30, PF 45, inv/ev intact ?Negative ant drawer, talar tilt, rotation test, squeeze test. ?Neg thompson ?No pain with resisted inversion or eversion  ? ? ?Electronically signed by:  ?Carl [name] D.Judd Gaudier ?[facility] ?12:48 PM 12/18/21 ?

## 2021-12-23 ENCOUNTER — Ambulatory Visit: Payer: BC Managed Care – PPO | Admitting: Emergency Medicine

## 2022-01-14 ENCOUNTER — Other Ambulatory Visit: Payer: Self-pay | Admitting: Sports Medicine

## 2022-05-19 ENCOUNTER — Encounter: Payer: Managed Care, Other (non HMO) | Admitting: Nurse Practitioner

## 2022-05-20 ENCOUNTER — Encounter: Payer: Self-pay | Admitting: Nurse Practitioner

## 2022-05-20 ENCOUNTER — Ambulatory Visit: Payer: 59 | Admitting: Nurse Practitioner

## 2022-05-20 VITALS — BP 124/72 | HR 61 | Temp 97.4°F | Resp 10 | Ht 71.0 in | Wt 169.1 lb

## 2022-05-20 DIAGNOSIS — G8929 Other chronic pain: Secondary | ICD-10-CM

## 2022-05-20 DIAGNOSIS — L209 Atopic dermatitis, unspecified: Secondary | ICD-10-CM | POA: Insufficient documentation

## 2022-05-20 DIAGNOSIS — Z91013 Allergy to seafood: Secondary | ICD-10-CM

## 2022-05-20 DIAGNOSIS — E611 Iron deficiency: Secondary | ICD-10-CM | POA: Diagnosis not present

## 2022-05-20 DIAGNOSIS — Z0000 Encounter for general adult medical examination without abnormal findings: Secondary | ICD-10-CM

## 2022-05-20 DIAGNOSIS — Z23 Encounter for immunization: Secondary | ICD-10-CM

## 2022-05-20 DIAGNOSIS — M25562 Pain in left knee: Secondary | ICD-10-CM

## 2022-05-20 DIAGNOSIS — R6889 Other general symptoms and signs: Secondary | ICD-10-CM

## 2022-05-20 DIAGNOSIS — J4520 Mild intermittent asthma, uncomplicated: Secondary | ICD-10-CM

## 2022-05-20 DIAGNOSIS — J309 Allergic rhinitis, unspecified: Secondary | ICD-10-CM | POA: Insufficient documentation

## 2022-05-20 NOTE — Patient Instructions (Signed)
Nice to see you today I will be in touch with the labs once I have them  Follow up in 1 year for your next physical, sooner if you need me

## 2022-05-20 NOTE — Progress Notes (Signed)
New Patient Office Visit  Subjective    Patient ID: Carl [name], male    DOB: 05-27-2001  Age: 21 y.o. MRN: [id number]  CC:  Chief Complaint  Patient presents with   Establish Care    Previous PCP with [facility]   Annual Exam    Would like to check labs.    HPI Carl [name] presents to establish care   Asthma: states that he has not had to use it recently. States worse with the seasons and goes along with allergies twice a year  Allergies: States that he has never   for complete physical and follow up of chronic conditions.  Immunizations: -Tetanus:due today -Influenza: due today -Covid-19: refused -Shingles: Too young -Pneumonia: Too young  -HPV:  Diet: Fair diet. States that it depends on work. States better at work. 2 meals at work and some snacks. States he will try water and some caffiene drink ( coffee or monster) Exercise:  Run every day ( 5 miles) and some training with work  Eye exam: . Glasses for distance Dental exam: Completes semi-annually    Colonoscopy: Too young, currently average risk. Lung Cancer Screening: N/A Dexa: Too young  PSA: Too young, currently average risk  Sleep:goes to bed around 11 and will get up at 655 for work on days off he will sleep from 9a-4a. Does not snore.   Sexually transmitted disease: Patient has no concern and declined testing.      Outpatient Encounter Medications as of 05/20/2022  Medication Sig   albuterol (VENTOLIN HFA) 108 (90 Base) MCG/ACT inhaler 2 puffs   cetirizine (ZYRTEC) 5 MG tablet Take 5 mg by mouth daily.   EPINEPHrine 0.3 mg/0.3 mL IJ SOAJ injection auto-injector into outer thigh   [DISCONTINUED] cephALEXin (KEFLEX) 500 MG capsule Take 1 capsule (500 mg total) by mouth 2 (two) times daily.   [DISCONTINUED] fexofenadine-pseudoephedrine (ALLEGRA-D 24) 180-240 MG 24 hr tablet Take 1 tablet by mouth daily.   [DISCONTINUED] ibuprofen (ADVIL,MOTRIN) 200 MG tablet Take 200 mg by mouth every 6  (six) hours as needed.   [DISCONTINUED] meloxicam (MOBIC) 15 MG tablet Take 1 tablet (15 mg total) by mouth daily.   [DISCONTINUED] meloxicam (MOBIC) 15 MG tablet Take 1 tablet (15 mg total) by mouth daily.   [DISCONTINUED] montelukast (SINGULAIR) 10 MG tablet Take 10 mg by mouth at bedtime.   [DISCONTINUED] predniSONE (DELTASONE) 50 MG tablet Take one tablet daily for the next 5 days.   [DISCONTINUED] tiZANidine (ZANAFLEX) 4 MG tablet Take 1 tablet (4 mg total) by mouth at bedtime.   [DISCONTINUED] Vitamin D, Ergocalciferol, (DRISDOL) 50000 units CAPS capsule Take 1 capsule (50,000 Units total) by mouth every 7 (seven) days.   No facility-administered encounter medications on file as of 05/20/2022.    Past Medical History:  Diagnosis Date   Allergic rhinitis    Asthma     Past Surgical History:  Procedure Laterality Date   ANTERIOR CRUCIATE LIGAMENT REPAIR Left     Family History  Problem Relation Age of Onset   Multiple sclerosis Mother    Diabetes Father    Breast cancer Maternal Grandmother    Skin cancer Maternal Grandmother    Lung cancer Maternal Grandfather    Cancer Paternal Grandmother        Lower GI area, not sure of the type    Social History   Socioeconomic History   Marital status: Single    Spouse name: Not on file   Number of  children: 0   Years of education: Not on file   Highest education level: High school graduate  Occupational History   Not on file  Tobacco Use   Smoking status: Never   Smokeless tobacco: Never  Vaping Use   Vaping Use: Never used  Substance and Sexual Activity   Alcohol use: Yes    Comment: very rare once every 2 weeks will have approx 3 drinks   Drug use: Never   Sexual activity: Not on file  Other Topics Concern   Not on file  Social History Narrative   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 Negative for chills and fever.  Respiratory:  Negative for shortness of breath.   Cardiovascular:  Negative for chest pain and leg swelling.  Gastrointestinal:  Negative for abdominal pain, blood in stool, diarrhea, nausea and vomiting.       BM daily  Genitourinary:  Negative for dysuria and hematuria.  Neurological:  Negative for tingling and headaches.  Psychiatric/Behavioral:  Negative for hallucinations and suicidal ideas.         Objective    BP 124/72   Pulse 61   Temp (!) 97.4 F (36.3 C)   Resp 10   Ht 5\' 11"  (1.803 m)   Wt 169 lb 2 oz (76.7 kg)   SpO2 96%   BMI 23.59 kg/m   Physical Exam Vitals and nursing note reviewed. Exam conducted with a chaperone present [facility], RMA).  Constitutional:      Appearance: Normal appearance.  HENT:     Right Ear: Tympanic membrane, ear canal and external ear normal.     Left Ear: Tympanic membrane, ear canal and external ear normal.     Mouth/Throat:     Mouth: Mucous membranes are moist.     Pharynx: Oropharynx is clear.  Eyes:     Extraocular Movements: Extraocular movements intact.     Pupils: Pupils are equal, round, and reactive to light.  Cardiovascular:     Rate and Rhythm: Normal rate and regular rhythm.     Pulses: Normal pulses.     Heart sounds: Normal heart sounds.  Pulmonary:     Effort: Pulmonary effort is normal.     Breath sounds: Normal breath sounds.  Abdominal:     General: Bowel sounds are normal. There is no distension.     Palpations: There is no mass.     Tenderness: There is no abdominal tenderness.     Hernia: No hernia is present. There is no hernia in the left inguinal area or right inguinal area.  Genitourinary:    Penis: Normal.      Testes: Normal.      Epididymis:     Right: Normal.     Left: Normal.  Musculoskeletal:     Right lower leg: No edema.     Left lower leg: No edema.  Lymphadenopathy:     Cervical: No cervical adenopathy.     Lower Body: No right inguinal adenopathy. No left inguinal adenopathy.  Skin:    General: Skin is warm.  Neurological:     General: No focal deficit present.  Mental Status: He is alert.     Deep Tendon Reflexes:     Reflex Scores:      Bicep reflexes are 2+ on the right side and 2+ on the left side.      Patellar reflexes are 2+ on the right side and 2+ on the left side.    Comments: Bilateral upper and lower extremity strength 5/5  Psychiatric:        Mood and Affect: Mood normal.        Behavior: Behavior normal.        Thought Content: Thought content normal.        Judgment: Judgment normal.         Assessment & Plan:   Problem List Items Addressed This Visit       Respiratory   Mild intermittent asthma    Patient currently maintained on albuterol inhaler as needed and cetirizine.      Relevant Medications   albuterol (VENTOLIN HFA) 108 (90 Base) MCG/ACT inhaler     Other   Chronic pain of left knee   Iron deficiency    Patient has history of iron deficiency and feeling cold all the time.  Pending lab results will add on iron panel and ferritin.      Relevant Orders   IBC + Ferritin   Seafood allergy    Patient has seafood allergy.  Does have EpiPen's on hand.  Offered to renew or send in refill of EpiPen's.  Patient politely declined.  Did review with patient how to use EpiPen and if EpiPen administered he will need to go to the nearest emergency department.  He acknowledged      Preventative health care - Primary    Discussed age-appropriate immunizations and screening exams with patient office.  Appropriate orders placed in office      Relevant Orders   CBC   Lipid panel   Comprehensive metabolic panel   TSH   Other Visit Diagnoses     Need for tetanus  booster       Relevant Orders   Td : Tetanus/diphtheria >7yo Preservative  free (Completed)   Need for influenza vaccination       Relevant Orders   Flu Vaccine QUAD 6+ mos PF IM (Fluarix Quad PF)   Cold feeling       Relevant Orders   IBC + Ferritin       Return in about 1 year (around 05/21/2023) for CPE and labs.   Audria Nine, NP

## 2022-05-20 NOTE — Assessment & Plan Note (Signed)
Patient has seafood allergy.  Does have EpiPen's on hand.  Offered to renew or send in refill of EpiPen's.  Patient politely declined.  Did review with patient how to use EpiPen and if EpiPen administered he will need to go to the nearest emergency department.  He acknowledged

## 2022-05-20 NOTE — Assessment & Plan Note (Signed)
Patient currently maintained on albuterol inhaler as needed and cetirizine.

## 2022-05-20 NOTE — Assessment & Plan Note (Signed)
Patient has history of iron deficiency and feeling cold all the time.  Pending lab results will add on iron panel and ferritin.

## 2022-05-20 NOTE — Assessment & Plan Note (Signed)
Discussed age-appropriate immunizations and screening exams with patient office.  Appropriate orders placed in office

## 2022-05-21 LAB — CBC
HCT: 40 % (ref 39.0–52.0)
Hemoglobin: 13.5 g/dL (ref 13.0–17.0)
MCHC: 33.7 g/dL (ref 30.0–36.0)
MCV: 86 fl (ref 78.0–100.0)
Platelets: 248 10*3/uL (ref 150.0–400.0)
RBC: 4.66 Mil/uL (ref 4.22–5.81)
RDW: 13.2 % (ref 11.5–15.5)
WBC: 8.3 10*3/uL (ref 4.0–10.5)

## 2022-05-21 LAB — COMPREHENSIVE METABOLIC PANEL
ALT: 18 U/L (ref 0–53)
AST: 23 U/L (ref 0–37)
Albumin: 4.6 g/dL (ref 3.5–5.2)
Alkaline Phosphatase: 72 U/L (ref 39–117)
BUN: 16 mg/dL (ref 6–23)
CO2: 27 mEq/L (ref 19–32)
Calcium: 9.8 mg/dL (ref 8.4–10.5)
Chloride: 102 mEq/L (ref 96–112)
Creatinine, Ser: 1.41 mg/dL (ref 0.40–1.50)
GFR: 71.15 mL/min (ref >60.00)
Glucose, Bld: 75 mg/dL (ref 70–99)
Potassium: 4.2 mEq/L (ref 3.5–5.1)
Sodium: 140 mEq/L (ref 135–145)
Total Bilirubin: 0.5 mg/dL (ref 0.2–1.2)
Total Protein: 7.3 g/dL (ref 6.0–8.3)

## 2022-05-21 LAB — LIPID PANEL
Cholesterol: 189 mg/dL (ref 0–200)
HDL: 51.5 mg/dL (ref >39.00)
LDL Cholesterol: 106 mg/dL — ABNORMAL HIGH (ref 0–99)
NonHDL: 137.38
Total CHOL/HDL Ratio: 4
Triglycerides: 156 mg/dL — ABNORMAL HIGH (ref 0.0–149.0)
VLDL: 31.2 mg/dL (ref 0.0–40.0)

## 2022-05-21 LAB — IBC + FERRITIN
Ferritin: 61.7 ng/mL (ref 22.0–322.0)
Iron: 100 ug/dL (ref 42–165)
Saturation Ratios: 27.8 % (ref 20.0–50.0)
TIBC: 359.8 ug/dL (ref 250.0–450.0)
Transferrin: 257 mg/dL (ref 212.0–360.0)

## 2022-05-21 LAB — TSH: TSH: 2.14 u[IU]/mL (ref 0.35–5.50)

## 2022-10-29 ENCOUNTER — Ambulatory Visit: Payer: No Typology Code available for payment source | Admitting: Family Medicine

## 2022-10-29 ENCOUNTER — Encounter: Payer: Self-pay | Admitting: Family Medicine

## 2022-10-29 ENCOUNTER — Ambulatory Visit (INDEPENDENT_AMBULATORY_CARE_PROVIDER_SITE_OTHER): Payer: No Typology Code available for payment source

## 2022-10-29 ENCOUNTER — Ambulatory Visit: Payer: Self-pay

## 2022-10-29 VITALS — BP 124/82 | HR 79 | Ht 71.0 in | Wt 171.2 lb

## 2022-10-29 DIAGNOSIS — Z89231 Acquired absence of right shoulder: Secondary | ICD-10-CM | POA: Diagnosis not present

## 2022-10-29 DIAGNOSIS — M25511 Pain in right shoulder: Secondary | ICD-10-CM

## 2022-10-29 MED ORDER — TIZANIDINE HCL 4 MG PO TABS: 4.0000 mg | ORAL_TABLET | Freq: Three times a day (TID) | ORAL | 1 refills | Status: DC | PRN

## 2022-10-29 NOTE — Progress Notes (Signed)
Right shoulder x-ray shows no fractures.  No dislocation visible.  MRI should be helpful.

## 2022-10-29 NOTE — Progress Notes (Unsigned)
I,Shaketha Jeon,acting as a scribe for Lynne Leader, MD.,have documented all relevant documentation on the behalf of Lynne Leader, MD,as directed by  Lynne Leader, MD while in the presence of Lynne Leader, MD.  Carl [name] is a 22 y.o. male who presents to [place] at [facility] today for right shoulder pain s/p snowboarding injury 10/28/22. Right arm was pulled back behind him when falling. Pain at anterior aspect, more lateral and posterior with movement. Notes n/t into the right hand and aching in the lower arm. Denies decreased grip strength, neck pain, prior injury. Used ice and Tylenol after getting home yesterday and this morning. Some trouble sleeping last night.   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   Pertinent review of systems: No fevers or chills  Relevant historical information: Asthma   Exam:  BP 124/82   Pulse 79   Ht '5\' 11"'$  (1.803 m)   Wt 171 lb 3.2 oz (77.7 kg)   SpO2 95%   BMI 23.88 kg/m  General: Well Developed, well nourished, and in no acute distress.   MSK: Right shoulder: Normal-appearing Tender palpation superior lateral shoulder. Very limited range of motion abduction 90 degrees.  External rotation 30 degrees beyond neutral position internal rotation limited to posterior iliac crest. Strength 4/5 to abduction and external rotation 5/5 internal and internal rotation. Inadequate positioning for impingement testing due to guarding and pain.    Lab and Radiology Results  Diagnostic Limited MSK Ultrasound of: Right shoulder Biceps tendon intact normal-appearing Subscapularis tendon intact without visible tear. Supraspinatus tendon no obvious rotator cuff tear is visible although positioning was not fully adequate. Infraspinatus tendon appears to be intact. AC joint mild effusion. Impression: No obvious rotator cuff tear is visible  although positioning was not ideal for this test.    No results found for this or any previous visit (from the past 72 hour(s)). DG Shoulder Right  Result Date: 10/29/2022 CLINICAL DATA:  Shoulder pain, snowboarding injury EXAM: RIGHT SHOULDER - 2+ VIEW COMPARISON:  None Available. FINDINGS: There is no evidence of fracture or dislocation. There is no evidence of arthropathy or other focal bone abnormality. Soft tissues are unremarkable. IMPRESSION: No acute abnormality or malalignment by plain radiography Electronically Signed   By: Jerilynn Mages.  Shick M.D.   On: 10/29/2022 10:32   I, Lynne Leader, personally (independently) visualized and performed the interpretation of the images attached in this note.     Assessment and Plan: 22 y.o. male with right shoulder pain.  Patient had a high-energy fall snowboarding with his arm abducted.  He is at risk for significant shoulder injury including rotator cuff tear not visible on ultrasound or labrum tear.  He may have had a shoulder subluxation event.  It is hard to tell the degree of injury today as he is guarding so much. He works as a Airline pilot and clearly he cannot do that job right now.  It is possible that in the next few days he is going to get significantly better and be able to return to work.  However I am prepared in case that does not happen with physical therapy.  Given the nature of his injury and concern for labrum tear we will proceed to MRI arthrogram.  For pain control high-dose NSAIDs and Tylenol and tizanidine.  We talked about opiates.  He did not want any.   PDMP not  reviewed this encounter. Orders Placed This Encounter  Procedures   [place] LIMITED JOINT SPACE STRUCTURES UP RIGHT    Order Specific Question:   Reason for Exam (SYMPTOM  OR DIAGNOSIS REQUIRED)    Answer:   right shoulder injury    Order Specific Question:   Preferred imaging location?    Answer:   [place]   MR Shoulder Right W Contrast     Schedule with Dr T 1 hr prior to MRI for contrast inejction.  Arthrogram only no IV contrast    Standing Status:   Future    Standing Expiration Date:   10/30/2023    Scheduling Instructions:     Schedule with Dr T 1 hr prior to MRI for contrast inejction.      Arthrogram only no IV contrast    Order Specific Question:   Reason for Exam (SYMPTOM  OR DIAGNOSIS REQUIRED)    Answer:   eval shoulder pain. Suspect labrum    Order Specific Question:   If indicated for the ordered procedure, I authorize the administration of contrast media per Radiology protocol    Answer:   Yes    Order Specific Question:   What is the patient's sedation requirement?    Answer:   No Sedation    Order Specific Question:   Does the patient have a pacemaker or implanted devices?    Answer:   No    Order Specific Question:   Preferred imaging location?    Answer:   [facility] (table limit-350lbs)   DG Shoulder Right    Standing Status:   Future    Number of Occurrences:   1    Standing Expiration Date:   10/30/2023    Order Specific Question:   Reason for Exam (SYMPTOM  OR DIAGNOSIS REQUIRED)    Answer:   shoulder pain    Order Specific Question:   Preferred imaging location?    Answer:   [facility]   Ambulatory referral to Physical Therapy    Referral Priority:   Routine    Referral Type:   Physical Medicine    Referral Reason:   Specialty Services Required    Requested Specialty:   Physical Therapy    Number of Visits Requested:   1   Meds ordered this encounter  Medications   tiZANidine (ZANAFLEX) 4 MG tablet    Sig: Take 1 tablet (4 mg total) by mouth every 8 (eight) hours as needed for muscle spasms.    Dispense:  30 tablet    Refill:  1     Discussed warning signs or symptoms. Please see discharge instructions. Patient expresses understanding.   The above documentation has been reviewed and is accurate and complete Lynne Leader, M.D.

## 2022-10-29 NOTE — Patient Instructions (Addendum)
Thank you for coming in today.   Please get an Xray today before you leave   I've referred you to Physical Therapy.  Let us know if you don't hear from them in one week.   You should hear from MRI scheduling within 1 week. If you do not hear please let me know.

## 2022-11-09 ENCOUNTER — Ambulatory Visit (INDEPENDENT_AMBULATORY_CARE_PROVIDER_SITE_OTHER): Payer: No Typology Code available for payment source

## 2022-11-09 ENCOUNTER — Ambulatory Visit: Payer: No Typology Code available for payment source | Admitting: Sports Medicine

## 2022-11-09 DIAGNOSIS — G8929 Other chronic pain: Secondary | ICD-10-CM

## 2022-11-09 DIAGNOSIS — M25511 Pain in right shoulder: Secondary | ICD-10-CM | POA: Diagnosis not present

## 2022-11-09 MED ORDER — TRIAMCINOLONE ACETONIDE 40 MG/ML IJ SUSP: 40.0000 mg | Freq: Once | INTRAMUSCULAR | Status: AC

## 2022-11-09 NOTE — Progress Notes (Signed)
    Procedures performed today:    Procedure: Real-time Ultrasound Guided gadolinium contrast injection of right glenohumeral joint Device: Samsung HS60  Verbal informed consent obtained.  Time-out conducted.  Noted no overlying erythema, induration, or other signs of local infection.  Skin prepped in a sterile fashion.  Local anesthesia: Topical Ethyl chloride.  With sterile technique and under real time ultrasound guidance: I advanced into the joint from a posterior approach, injected 1 cc kenalog 40, 2 cc lidocaine, 2 cc bupivacaine, syringe switched and 0.1 cc gadolinium injected, send again switched and 10 cc sterile saline used to fully into the joint. Joint visualized and capsule seen distending confirming intra-articular placement of contrast material and medication. Completed without difficulty  Advised to call if fevers/chills, erythema, induration, drainage, or persistent bleeding.  Images permanently stored in PACS Impression: Technically successful ultrasound guided gadolinium contrast injection for MR arthrography.  Please see separate MR arthrogram report.  Independent interpretation of notes and tests performed by another provider:   None.  Brief History, Exam, Impression, and Recommendations:    Chronic right shoulder pain Injection for MR arthrography, further management per primary treating provider.    ____________________________________________ Gwen Her. Dianah Field, M.D., ABFM., CAQSM., AME. Primary Care and Sports Medicine [facility]  Adjunct Professor of [employer]  Risk manager

## 2022-11-09 NOTE — Assessment & Plan Note (Signed)
Injection for MR arthrography, further management per primary treating provider.

## 2022-11-10 NOTE — Progress Notes (Signed)
Great news.  You did not tear any rotator cuff tendons or labrum   However you did bruise the shoulder bone pretty bad which would explain your pain.  Recommend return to clinic to go over the results and talk about treatment plan and options going forward.  You will not need surgery and may not need much fancy treatment at all.  Maybe a little physical therapy.

## 2023-04-21 ENCOUNTER — Encounter (INDEPENDENT_AMBULATORY_CARE_PROVIDER_SITE_OTHER): Payer: Self-pay

## 2023-04-27 DIAGNOSIS — F900 Attention-deficit hyperactivity disorder, predominantly inattentive type: Secondary | ICD-10-CM | POA: Diagnosis not present

## 2023-05-24 ENCOUNTER — Encounter: Payer: Self-pay | Admitting: Nurse Practitioner

## 2023-05-24 ENCOUNTER — Ambulatory Visit: Payer: BC Managed Care – PPO | Admitting: Nurse Practitioner

## 2023-05-24 VITALS — BP 110/70 | HR 56 | Temp 98.2°F | Ht 71.0 in | Wt 171.8 lb

## 2023-05-24 DIAGNOSIS — J4520 Mild intermittent asthma, uncomplicated: Secondary | ICD-10-CM

## 2023-05-24 DIAGNOSIS — E7800 Pure hypercholesterolemia, unspecified: Secondary | ICD-10-CM | POA: Insufficient documentation

## 2023-05-24 DIAGNOSIS — J301 Allergic rhinitis due to pollen: Secondary | ICD-10-CM

## 2023-05-24 DIAGNOSIS — Z91013 Allergy to seafood: Secondary | ICD-10-CM

## 2023-05-24 DIAGNOSIS — Z0000 Encounter for general adult medical examination without abnormal findings: Secondary | ICD-10-CM

## 2023-05-24 DIAGNOSIS — Z1159 Encounter for screening for other viral diseases: Secondary | ICD-10-CM

## 2023-05-24 DIAGNOSIS — Z114 Encounter for screening for human immunodeficiency virus [HIV]: Secondary | ICD-10-CM | POA: Diagnosis not present

## 2023-05-24 LAB — COMPREHENSIVE METABOLIC PANEL
ALT: 19 U/L (ref 0–53)
AST: 22 U/L (ref 0–37)
Albumin: 4.3 g/dL (ref 3.5–5.2)
Alkaline Phosphatase: 54 U/L (ref 39–117)
BUN: 10 mg/dL (ref 6–23)
CO2: 28 meq/L (ref 19–32)
Calcium: 9.4 mg/dL (ref 8.4–10.5)
Chloride: 103 meq/L (ref 96–112)
Creatinine, Ser: 1.23 mg/dL (ref 0.40–1.50)
GFR: 83.24 mL/min (ref >60.00)
Glucose, Bld: 91 mg/dL (ref 70–99)
Potassium: 3.9 meq/L (ref 3.5–5.1)
Sodium: 138 meq/L (ref 135–145)
Total Bilirubin: 0.6 mg/dL (ref 0.2–1.2)
Total Protein: 6.7 g/dL (ref 6.0–8.3)

## 2023-05-24 LAB — CBC
HCT: 42.3 % (ref 39.0–52.0)
Hemoglobin: 13.9 g/dL (ref 13.0–17.0)
MCHC: 32.8 g/dL (ref 30.0–36.0)
MCV: 86.4 fl (ref 78.0–100.0)
Platelets: 226 10*3/uL (ref 150.0–400.0)
RBC: 4.9 Mil/uL (ref 4.22–5.81)
RDW: 13.3 % (ref 11.5–15.5)
WBC: 4.8 10*3/uL (ref 4.0–10.5)

## 2023-05-24 LAB — LIPID PANEL
Cholesterol: 216 mg/dL — ABNORMAL HIGH (ref 0–200)
HDL: 56.1 mg/dL (ref >39.00)
LDL Cholesterol: 141 mg/dL — ABNORMAL HIGH (ref 0–99)
NonHDL: 160.26
Total CHOL/HDL Ratio: 4
Triglycerides: 95 mg/dL (ref 0.0–149.0)
VLDL: 19 mg/dL (ref 0.0–40.0)

## 2023-05-24 LAB — TSH: TSH: 3.75 u[IU]/mL (ref 0.35–5.50)

## 2023-05-24 NOTE — Assessment & Plan Note (Signed)
Discussed age-appropriate immunizations and screening exams.  Did review patient's personal, surgical, social, family histories.  Patient currently up-to-date on all age-appropriate vaccinations he would like.  He declined the flu vaccine today.  Patient is too young for CRC screening or prostate cancer screening.  Patient was given information at discharge about preventative healthcare maintenance with anticipatory guidance

## 2023-05-24 NOTE — Assessment & Plan Note (Signed)
Followed by an allergist.  Patient currently on Zyrtec and Allegra.  Continue medication as prescribed follow-up with specialist as recommended

## 2023-05-24 NOTE — Assessment & Plan Note (Signed)
Patient uses albuterol inhaler intermittently.  Infrequent use.  Is followed by allergist.  Continue albuterol inhaler as prescribed

## 2023-05-24 NOTE — Patient Instructions (Signed)
Nice to see you today I will be in touch with the labs once I have them Follow up with me in 1 year, sooner if you need me

## 2023-05-24 NOTE — Progress Notes (Signed)
Established Patient Office Visit  Subjective   Patient ID: Carl [name], male    DOB: [birth year]  Age: 22 y.o. MRN: [id number]  Chief Complaint  Patient presents with   Establish Care    HPI  for complete physical and follow up of chronic conditions.   Asthma: states that less than once a month on the inhaler use. Patient is being followed by an allergies . Patient is currently on zyrtec and allegra    Shellfish allergies: has an epi pen that he has never used   ADD/ADHD: following by psychiatrist on methylphenidate 10mg . Just recently started it    Immunizations: -Tetanus: Completed in 2023 -Influenza:  refused  -Shingles: too young -Pneumonia: too young  -HPV: utd -covid: refused   Diet: Fair diet. States 2 meals a day with snacks. Drink water Exercise: States that he go to the gym 2-3 a week 2 hours at time   Eye exam: PRN  Dental exam: Completes semi-annually    Colonoscopy: too young, currently average risk. Did have a grandparent with colon ca Lung Cancer Screening: NA   PSA: too young currently average risk   Sleep:  states that it will vary depending on work . He will od 24 hour shifts. Does snore   STD/STI: declines being checked      Review of Systems  Constitutional:  Negative for chills and fever.  Respiratory:  Negative for shortness of breath.   Cardiovascular:  Negative for chest pain and leg swelling.  Gastrointestinal:  Negative for abdominal pain, blood in stool, constipation, diarrhea, nausea and vomiting.       BM every other day   Genitourinary:  Negative for dysuria and hematuria.  Neurological:  Negative for tingling and headaches.  Psychiatric/Behavioral:  Negative for hallucinations and suicidal ideas.       Objective:     BP 110/70   Pulse (!) 56   Temp 98.2 F (36.8 C) (Temporal)   Ht 5\' 11"  (1.803 m)   Wt 171 lb 12.8 oz (77.9 kg)   SpO2 96%   BMI 23.96 kg/m  BP Readings from Last 3 Encounters:  05/24/23 110/70   10/29/22 124/82  05/20/22 124/72   Wt Readings from Last 3 Encounters:  05/24/23 171 lb 12.8 oz (77.9 kg)  10/29/22 171 lb 3.2 oz (77.7 kg)  05/20/22 169 lb 2 oz (76.7 kg)      Physical Exam Vitals and nursing note reviewed. Exam conducted with a chaperone present Melina Copa, CMA).  Constitutional:      Appearance: Normal appearance.  HENT:     Right Ear: Tympanic membrane, ear canal and external ear normal.     Left Ear: Tympanic membrane, ear canal and external ear normal.     Mouth/Throat:     Mouth: Mucous membranes are moist.     Pharynx: Oropharynx is clear.  Eyes:     Extraocular Movements: Extraocular movements intact.     Pupils: Pupils are equal, round, and reactive to light.  Cardiovascular:     Rate and Rhythm: Normal rate and regular rhythm.     Pulses: Normal pulses.     Heart sounds: Normal heart sounds.  Pulmonary:     Effort: Pulmonary effort is normal.     Breath sounds: Normal breath sounds.  Abdominal:     General: Bowel sounds are normal. There is no distension.     Palpations: There is no mass.     Tenderness: There is no abdominal tenderness.  Hernia: No hernia is present. There is no hernia in the left inguinal area or right inguinal area.  Genitourinary:    Penis: Normal.      Testes: Normal.     Epididymis:     Right: Normal.     Left: Normal.  Musculoskeletal:     Right lower leg: No edema.     Left lower leg: No edema.  Lymphadenopathy:     Cervical: No cervical adenopathy.     Lower Body: No right inguinal adenopathy. No left inguinal adenopathy.  Skin:    General: Skin is warm.  Neurological:     General: No focal deficit present.     Mental Status: He is alert.     Deep Tendon Reflexes:     Reflex Scores:      Bicep reflexes are 2+ on the right side and 2+ on the left side.      Patellar reflexes are 2+ on the right side and 2+ on the left side.    Comments: Bilateral upper and lower extremity strength 5/5  Psychiatric:         Mood and Affect: Mood normal.        Behavior: Behavior normal.        Thought Content: Thought content normal.        Judgment: Judgment normal.      No results found for any visits on 05/24/23.    The ASCVD Risk score (Arnett DK, et al., 2019) failed to calculate for the following reasons:   The 2019 ASCVD risk score is only valid for ages 41 to 43    Assessment & Plan:   Problem List Items Addressed This Visit       Respiratory   Mild intermittent asthma    Patient uses albuterol inhaler intermittently.  Infrequent use.  Is followed by allergist.  Continue albuterol inhaler as prescribed      Allergic rhinitis    Followed by an allergist.  Patient currently on Zyrtec and Allegra.  Continue medication as prescribed follow-up with specialist as recommended        Other   Seafood allergy    Patient does have an EpiPen if needed.  He understands if he discharges EpiPen he needs to be seen in an acute care setting.      Preventative health care - Primary    Discussed age-appropriate immunizations and screening exams.  Did review patient's personal, surgical, social, family histories.  Patient currently up-to-date on all age-appropriate vaccinations he would like.  He declined the flu vaccine today.  Patient is too young for CRC screening or prostate cancer screening.  Patient was given information at discharge about preventative healthcare maintenance with anticipatory guidance      Relevant Orders   CBC   Comprehensive metabolic panel   TSH   Elevated LDL cholesterol level    History of same pending lipid panel today weight has stayed stable over the past year      Relevant Orders   Lipid panel   Other Visit Diagnoses     Encounter for hepatitis C screening test for low risk patient       Relevant Orders   Hepatitis C Antibody   Encounter for screening for HIV       Relevant Orders   HIV antibody (with reflex)       Return in about 1 year (around  05/23/2024) for CPE and Labs.    Audria Nine, NP

## 2023-05-24 NOTE — Assessment & Plan Note (Signed)
History of same pending lipid panel today weight has stayed stable over the past year

## 2023-05-24 NOTE — Assessment & Plan Note (Signed)
Patient does have an EpiPen if needed.  He understands if he discharges EpiPen he needs to be seen in an acute care setting.

## 2023-05-25 DIAGNOSIS — F900 Attention-deficit hyperactivity disorder, predominantly inattentive type: Secondary | ICD-10-CM | POA: Diagnosis not present

## 2023-06-23 DIAGNOSIS — F900 Attention-deficit hyperactivity disorder, predominantly inattentive type: Secondary | ICD-10-CM | POA: Diagnosis not present

## 2023-07-25 DIAGNOSIS — F900 Attention-deficit hyperactivity disorder, predominantly inattentive type: Secondary | ICD-10-CM | POA: Diagnosis not present

## 2023-09-21 DIAGNOSIS — F900 Attention-deficit hyperactivity disorder, predominantly inattentive type: Secondary | ICD-10-CM | POA: Diagnosis not present

## 2023-10-19 NOTE — Progress Notes (Signed)
  [name] [address] [place] [address] Phone: [phone] Subjective:   INadine Counts, am serving as a scribe for Dr. Antoine Primas.  I'm seeing this patient by the request  of:  Eden Emms, NP  CC: back and neck pain f/u   ONG:[id number]  Carl [name] is a 23 y.o. male coming in with complaint of back and neck pain. OMT 03/19/2021. Patient states R knee pain. Pain on the medial side. Pain hasn't gotten worse, but hasn't gone away. Started 3 weeks ago after snowboarding. Can't feel clicking and popping no associated with pain  Medications patient has been prescribed:   Taking:  Patient recently in March of last year did have an MRI of the shoulder that was normal except for some mild bone marrow edema.       Reviewed prior external information including notes and imaging from previsou exam, outside providers and external EMR if available.   As well as notes that were available from care everywhere and other healthcare systems.  Past medical history, social, surgical and family history all reviewed in electronic medical record.  No pertanent information unless stated regarding to the chief complaint.   Past Medical History:  Diagnosis Date   Allergic rhinitis    Asthma     Allergies  Allergen Reactions   Shellfish Allergy Other (See Comments)    Per allergy test     Review of Systems:  No headache, visual changes, nausea, vomiting, diarrhea, constipation, dizziness, abdominal pain, skin rash, fevers, chills, night sweats, weight loss, swollen lymph nodes, body aches, joint swelling, chest pain, shortness of breath, mood changes. POSITIVE muscle aches  Objective  Blood pressure 120/84, pulse 68, weight 165 lb (74.8 kg), SpO2 98%.   General: No apparent distress alert and oriented x3 mood and affect normal, dressed appropriately.  HEENT: Pupils equal, extraocular movements intact  Respiratory: Patient's speak in full  sentences and does not appear short of breath  Cardiovascular: No lower extremity edema, non tender, no erythema  Gait MSK:  Back does have some loss lordosis noted.  Some tenderness to palpation in the paraspinal musculature.  Tightness noted of the right hamstring noted compared to the other side.  Patient is tender to palpation over the medial aspect of the knee.  Limited muscular skeletal ultrasound was performed and interpreted by Antoine Primas, M  Limited ultrasound shows some hypoechoic changes noted in the right distal tendon no true retraction noted.    Assessment and Plan:  Hamstring tendinitis of right thigh Distal hamstring tendinopathy.  Has some limited sex secondary to hypermobility.  We have discussed that we need to continue to monitor.  Feeling significant instability of the knee noted of the hip.  We discussed avoiding certain activities.  Discussed compression sleeve, heel lifts given.  Discussed icing regimen.  Follow-up again in 6 to 8 weeks      The above documentation has been reviewed and is accurate and complete Judi Saa, DO         Note: This dictation was prepared with Dragon dictation along with smaller phrase technology. Any transcriptional errors that result from this process are unintentional.

## 2023-10-26 ENCOUNTER — Ambulatory Visit (INDEPENDENT_AMBULATORY_CARE_PROVIDER_SITE_OTHER): Payer: BC Managed Care – PPO | Admitting: Family Medicine

## 2023-10-26 ENCOUNTER — Encounter: Payer: Self-pay | Admitting: Family Medicine

## 2023-10-26 ENCOUNTER — Other Ambulatory Visit: Payer: Self-pay

## 2023-10-26 VITALS — BP 120/84 | HR 68 | Wt 165.0 lb

## 2023-10-26 DIAGNOSIS — M76891 Other specified enthesopathies of right lower limb, excluding foot: Secondary | ICD-10-CM | POA: Insufficient documentation

## 2023-10-26 DIAGNOSIS — M25561 Pain in right knee: Secondary | ICD-10-CM

## 2023-10-26 NOTE — Patient Instructions (Addendum)
 Good to see you  Ice 20 minutes 2 times daily. Usually after activity and before bed. Exercises 3 times a week.  Heel lift in the shoe for 2 weeks at least  Avoid full extension with working out.  Thigh compression sleeve daily with activity  See me again in 8 weeks if not perfect

## 2023-10-26 NOTE — Assessment & Plan Note (Signed)
 Distal hamstring tendinopathy.  Has some limited sex secondary to hypermobility.  We have discussed that we need to continue to monitor.  Feeling significant instability of the knee noted of the hip.  We discussed avoiding certain activities.  Discussed compression sleeve, heel lifts given.  Discussed icing regimen.  Follow-up again in 6 to 8 weeks

## 2023-11-22 DIAGNOSIS — Z91013 Allergy to seafood: Secondary | ICD-10-CM | POA: Diagnosis not present

## 2023-11-22 DIAGNOSIS — J0100 Acute maxillary sinusitis, unspecified: Secondary | ICD-10-CM | POA: Diagnosis not present

## 2023-11-22 DIAGNOSIS — L2089 Other atopic dermatitis: Secondary | ICD-10-CM | POA: Diagnosis not present

## 2023-11-22 DIAGNOSIS — J4520 Mild intermittent asthma, uncomplicated: Secondary | ICD-10-CM | POA: Diagnosis not present

## 2023-12-12 DIAGNOSIS — F900 Attention-deficit hyperactivity disorder, predominantly inattentive type: Secondary | ICD-10-CM | POA: Diagnosis not present

## 2023-12-12 NOTE — Progress Notes (Unsigned)
 [name] [address] [place] [address] Phone: [phone] Subjective:   Carl [name], am serving as a scribe for Dr. Antoine Primas.  I'm seeing this patient by the request  of:  Eden Emms, NP  CC: knee pain and back pain   VHQ:[id number]  10/26/2023 Distal hamstring tendinopathy.  Has some limited sex secondary to hypermobility.  We have discussed that we need to continue to monitor.  Feeling significant instability of the knee noted of the hip.  We discussed avoiding certain activities.  Discussed compression sleeve, heel lifts given.  Discussed icing regimen.  Follow-up again in 6 to 8 weeks      Update 12/13/2023 Carl [name] is a 23 y.o. male coming in with complaint of R knee pain. Patient states that he no longer has pain.   Back of L knee has been bothering him. Hx of ACL surgery.   Having some pain in L wrist since last visit. Manipulation was helpful. Pain in [facility] joint.       Past Medical History:  Diagnosis Date   Allergic rhinitis    Asthma    Past Surgical History:  Procedure Laterality Date   ANTERIOR CRUCIATE LIGAMENT REPAIR Left    Social History   Socioeconomic History   Marital status: Single    Spouse name: Not on file   Number of children: 0   Years of education: Not on file   Highest education level: High school graduate  Occupational History   Not on file  Tobacco Use   Smoking status: Never   Smokeless tobacco: Never  Vaping Use   Vaping status: Never Used  Substance and Sexual Activity   Alcohol use: Yes    Comment: very rare once every 2 weeks will have approx 3 drinks   Drug use: Never   Sexual activity: Not on file  Other Topics Concern   Not on file  Social History Narrative   Fulltime: Water quality scientist for [employer]   Social Drivers of Health   Financial Resource Strain: Not on file  Food Insecurity: Not on file  Transportation Needs: Not on file  Physical Activity: Not on  file  Stress: Not on file  Social Connections: Not on file   Allergies  Allergen Reactions   Shellfish Allergy Other (See Comments)    Per allergy test   Family History  Problem Relation Age of Onset   Multiple sclerosis Mother    Diabetes Father    Breast cancer Maternal Grandmother    Skin cancer Maternal Grandmother    Lung cancer Maternal Grandfather    Cancer Paternal Grandmother        Colon     Current Facility-Administered Medications (Endocrine & Metabolic):    triamcinolone acetonide (KENALOG-40) injection 40 mg  Current Outpatient Medications (Cardiovascular):    EPINEPHrine 0.3 mg/0.3 mL IJ SOAJ injection, auto-injector into outer thigh   Current Outpatient Medications (Respiratory):    albuterol (VENTOLIN HFA) 108 (90 Base) MCG/ACT inhaler, 2 puffs   cetirizine (ZYRTEC) 5 MG tablet, Take 5 mg by mouth daily.   fexofenadine (ALLEGRA) 180 MG tablet, 1 tablet Orally Once a day       Current Outpatient Medications (Other):    methylphenidate (RITALIN) 10 MG tablet, Take 10 mg by mouth 2 (two) times daily as needed.    Reviewed prior external information including notes and imaging from  primary care provider As well as notes that were available from care  everywhere and other healthcare systems.  Past medical history, social, surgical and family history all reviewed in electronic medical record.  No pertanent information unless stated regarding to the chief complaint.   Review of Systems:  No headache, visual changes, nausea, vomiting, diarrhea, constipation, dizziness, abdominal pain, skin rash, fevers, chills, night sweats, weight loss, swollen lymph nodes, body aches, joint swelling, chest pain, shortness of breath, mood changes. POSITIVE muscle aches  Objective  Blood pressure 120/78, pulse 68, height 5\' 11"  (1.803 m), SpO2 98%.   General: No apparent distress alert and oriented x3 mood and affect normal, dressed appropriately.  HEENT: Pupils equal,  extraocular movements intact  Respiratory: Patient's speak in full sentences and does not appear short of breath  Cardiovascular: No lower extremity edema, non tender, no erythema  Knee exam shows right knee shows no significant inflammation.  He does have more of tightness in the hamstrings bilaterally.  Limited muscular skeletal ultrasound was performed and interpreted by Antoine Primas, M  Left knee has hypoechoic changes in the patellofemoral joint.  No acute changes all appear to be chronic at this time.    Back exam shows loss lordosis noted.  Tightness in the paraspinal musculature.  Negative straight leg test noted.  Osteopathic findings C2 flexed rotated and side bent right C4 flexed rotated and side bent left C7 flexed rotated and side bent left T3 extended rotated and side bent right inhaled third rib T5 extended rotated and side bent left L1 flexed rotated and side bent right Sacrum right on right     Impression and Recommendations:     The above documentation has been reviewed and is accurate and complete Judi Saa, DO

## 2023-12-13 ENCOUNTER — Encounter: Payer: Self-pay | Admitting: Family Medicine

## 2023-12-13 ENCOUNTER — Ambulatory Visit: Payer: BC Managed Care – PPO | Admitting: Family Medicine

## 2023-12-13 ENCOUNTER — Other Ambulatory Visit: Payer: Self-pay

## 2023-12-13 VITALS — BP 120/78 | HR 68 | Ht 71.0 in

## 2023-12-13 DIAGNOSIS — M9902 Segmental and somatic dysfunction of thoracic region: Secondary | ICD-10-CM | POA: Diagnosis not present

## 2023-12-13 DIAGNOSIS — M25561 Pain in right knee: Secondary | ICD-10-CM | POA: Diagnosis not present

## 2023-12-13 DIAGNOSIS — M9904 Segmental and somatic dysfunction of sacral region: Secondary | ICD-10-CM

## 2023-12-13 DIAGNOSIS — M9908 Segmental and somatic dysfunction of rib cage: Secondary | ICD-10-CM

## 2023-12-13 DIAGNOSIS — M25562 Pain in left knee: Secondary | ICD-10-CM

## 2023-12-13 DIAGNOSIS — M9903 Segmental and somatic dysfunction of lumbar region: Secondary | ICD-10-CM | POA: Diagnosis not present

## 2023-12-13 DIAGNOSIS — M9901 Segmental and somatic dysfunction of cervical region: Secondary | ICD-10-CM | POA: Diagnosis not present

## 2023-12-13 DIAGNOSIS — G8929 Other chronic pain: Secondary | ICD-10-CM

## 2023-12-13 NOTE — Patient Instructions (Signed)
 Great to see you  The wrist is improving  Ice the knee after activity  See me again in 6 weeks

## 2023-12-13 NOTE — Assessment & Plan Note (Signed)
 Chronic swelling noted on the ultrasound today.  Discussed with patient about icing regimen and home exercises, discussed which activities to do and which ones to avoid.  Increase activity slowly otherwise.  I do not feel any significant instability.  AC appears to be intact.

## 2024-01-04 DIAGNOSIS — F900 Attention-deficit hyperactivity disorder, predominantly inattentive type: Secondary | ICD-10-CM | POA: Diagnosis not present

## 2024-01-05 ENCOUNTER — Other Ambulatory Visit: Payer: Self-pay

## 2024-01-05 ENCOUNTER — Emergency Department
Admission: EM | Admit: 2024-01-05 | Discharge: 2024-01-05 | Disposition: A | Discharge status: Home / Self Care | Payer: Worker's Compensation | Attending: Emergency Medicine | Admitting: Emergency Medicine

## 2024-01-05 ENCOUNTER — Encounter: Payer: Self-pay | Admitting: Emergency Medicine

## 2024-01-05 ENCOUNTER — Emergency Department: Payer: Worker's Compensation

## 2024-01-05 DIAGNOSIS — S4991XA Unspecified injury of right shoulder and upper arm, initial encounter: Secondary | ICD-10-CM | POA: Diagnosis present

## 2024-01-05 DIAGNOSIS — Y990 Civilian activity done for income or pay: Secondary | ICD-10-CM | POA: Insufficient documentation

## 2024-01-05 DIAGNOSIS — S43004A Unspecified dislocation of right shoulder joint, initial encounter: Secondary | ICD-10-CM

## 2024-01-05 DIAGNOSIS — X501XXA Overexertion from prolonged static or awkward postures, initial encounter: Secondary | ICD-10-CM | POA: Insufficient documentation

## 2024-01-05 DIAGNOSIS — S43014A Anterior dislocation of right humerus, initial encounter: Secondary | ICD-10-CM | POA: Insufficient documentation

## 2024-01-05 DIAGNOSIS — M7989 Other specified soft tissue disorders: Secondary | ICD-10-CM | POA: Diagnosis not present

## 2024-01-05 MED ORDER — SODIUM CHLORIDE 0.9 % IV BOLUS
1000.0000 mL | Freq: Once | INTRAVENOUS | Status: AC
  Administered 2024-01-05: 1000 mL via INTRAVENOUS

## 2024-01-05 MED ORDER — KETOROLAC TROMETHAMINE 30 MG/ML IJ SOLN
30.0000 mg | Freq: Once | INTRAMUSCULAR | Status: AC
  Administered 2024-01-05: 30 mg via INTRAVENOUS
  Filled 2024-01-05: qty 1

## 2024-01-05 MED ORDER — HYDROMORPHONE HCL 1 MG/ML IJ SOLN
1.0000 mg | Freq: Once | INTRAMUSCULAR | Status: AC
  Administered 2024-01-05: 1 mg via INTRAVENOUS
  Filled 2024-01-05: qty 1

## 2024-01-05 MED ORDER — ONDANSETRON HCL 4 MG/2ML IJ SOLN
4.0000 mg | Freq: Once | INTRAMUSCULAR | Status: AC
  Administered 2024-01-05: 4 mg via INTRAVENOUS
  Filled 2024-01-05: qty 2

## 2024-01-05 NOTE — Discharge Instructions (Signed)
 Please call the number provided for orthopedics to arrange a follow-up appointment in approximately 1 week.  Please wear your sling until cleared by orthopedics.  You may take this off to shower however do not raise your arm above 90 degrees.  Return to the emergency department for any significant pain, otherwise use Tylenol or ibuprofen as needed for discomfort as written on the box.

## 2024-01-05 NOTE — ED Notes (Signed)
 First Nurse Note: Pt to ED via POV. Pt is here for a shoulder injury, possible dislocation. Will be a workers comp case.

## 2024-01-05 NOTE — ED Triage Notes (Signed)
 Pt was at work training and was trying to catch a ladder when he injured his right shoulder. Pt is in NAD.

## 2024-01-05 NOTE — ED Notes (Signed)
 CMS remains intact, cap refill <2sec.

## 2024-01-05 NOTE — ED Notes (Signed)
 Shoulder reduced by EDP while lying prone R arm hanging off stretcher, pt tolerated well. Rolled supine, shoulder immobilizer applied, VSS. EKG done.

## 2024-01-05 NOTE — ED Notes (Signed)
 Xray at [facility] for post reduction film. Alert, NAD, calm, interactive. CMS intact.

## 2024-01-05 NOTE — ED Notes (Signed)
 Pt alert, NAD, calm, sitting upright, guarding R shoulder arm movements, CMS intact.

## 2024-01-05 NOTE — ED Provider Notes (Signed)
 [facility] Provider Note    Event Date/Time   First MD Initiated Contact with Patient 01/05/24 1213     (approximate)  History   Chief Complaint: Shoulder Injury  HPI  Carl [name] is a 23 y.o. male with no significant past medical history presents to the emergency department for a right shoulder injury.  According to the patient he was training trying to hold a ladder when he felt his shoulder dislocate.  Patient denies any falls or other trauma to the area.  No history of shoulder dislocation previously.  Physical Exam   Triage Vital Signs: ED Triage Vitals [01/05/24 1131]  Encounter Vitals Group     BP 114/87     Systolic BP Percentile      Diastolic BP Percentile      Pulse Rate 99     Resp 16     Temp 98 F (36.7 C)     Temp src      SpO2 100 %     Weight      Height      Head Circumference      Peak Flow      Pain Score 6     Pain Loc      Pain Education      Exclude from Growth Chart     Most recent vital signs: Vitals:   01/05/24 1131  BP: 114/87  Pulse: 99  Resp: 16  Temp: 98 F (36.7 C)  SpO2: 100%    General: Awake, no distress.  CV:  Good peripheral perfusion.   Resp:  Normal effort.  Abd:  No distention.  Other:  Deformity noted to the right shoulder consistent with shoulder dislocation.  Neurovascularly intact distally.   ED Results / Procedures / Treatments   RADIOLOGY  I have reviewed and interpreted the x-ray images consistent with dislocation I do not appreciate any obvious fracture.   MEDICATIONS ORDERED IN ED: Medications  HYDROmorphone  (DILAUDID ) injection 1 mg (has no administration in time range)  ondansetron  (ZOFRAN ) injection 4 mg (has no administration in time range)  ketorolac  (TORADOL ) 30 MG/ML injection 30 mg (has no administration in time range)  sodium chloride  0.9 % bolus 1,000 mL (has no administration in time range)     IMPRESSION / MDM / ASSESSMENT AND PLAN / ED COURSE  I reviewed  the triage vital signs and the nursing notes.  Patient's presentation is most consistent with acute presentation with potential threat to life or bodily function.  Patient presents emergency department for right shoulder pain.  X-ray consistent with dislocation.  We will dose Dilaudid  Zofran  and Toradol .  Will attempt to reduce in a prone position.  Patient agreeable to plan.  Arm is neurovascularly intact.   I was able to successfully reduce the shoulder in prone position with traction and scapular manipulation after pain medication.  Will repeat x-rays to ensure that the shoulder has been reduced.  As long as the shoulder is reduced we will place in a shoulder immobilizer and have the patient follow-up with orthopedics.  Remains neurovascularly intact.  Repeat x-ray reviewed by myself shows good reduction.  Confirmed by radiology.  Reduction of dislocation Date/Time: 12:37 PM Performed by: Ruth Cove Authorized by: Ruth Cove Consent: Verbal consent obtained. Risks and benefits: risks, benefits and alternatives were discussed Consent given by: patient Required items: required blood products, implants, devices, and special equipment available Time out: Immediately prior to procedure a "time out" was called to  verify the correct patient, procedure, equipment, support staff and site/side marked as required.  Patient sedated: No, Dilaudid /Toradol /Zofran  prior to procedure.  Vitals: Vital signs were monitored during sedation. Patient tolerance: Patient tolerated the procedure well with no immediate complications. Joint: Right shoulder Reduction technique: Prone positioning with traction and scapular manipulation    FINAL CLINICAL IMPRESSION(S) / ED DIAGNOSES   Right shoulder dislocation   Note:  This document was prepared using Dragon voice recognition software and may include unintentional dictation errors.   Ruth Cove, MD 01/05/24 1341

## 2024-01-05 NOTE — ED Provider Triage Note (Signed)
 Emergency Medicine Provider Triage Evaluation Note  Guerdon Aragon , a 23 y.o. male  was evaluated in triage.  Pt complains of possible right shoulder dislocation.  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  Happened today at work.  Review of Systems  Positive:  Negative:   Physical Exam  There were no vitals taken for this visit. Gen:   Awake, no distress   Resp:  Normal effort  MSK:   Most likely dislocation of the right shoulder noted, patient cannot move the arm Other:    Medical Decision Making  Medically screening exam initiated at 11:28 AM.  Appropriate orders placed.  Keyontay Wambach was informed that the remainder of the evaluation will be completed by another provider, this initial triage assessment does not replace that evaluation, and the importance of remaining in the ED until their evaluation is complete.  X-ray right shoulder   Delsie Figures, PA-C 01/05/24 1128

## 2024-01-06 ENCOUNTER — Encounter: Payer: Self-pay | Admitting: Family Medicine

## 2024-01-10 NOTE — Progress Notes (Unsigned)
    [name] [address] [place] [address] Phone: [phone]   Assessment and Plan:     There are no diagnoses linked to this encounter.  ***   Pertinent previous records reviewed include ***    Follow Up: ***     Subjective:   I, Carl [name], am serving as a Neurosurgeon for Doctor Carl [name]  Chief Complaint: right knee pain   HPI:   01/11/2024 Patient is a 23 year old male with right knee pain. Patient states   Relevant Historical Information: ***  Additional pertinent review of systems negative.   Current Outpatient Medications:    albuterol (VENTOLIN HFA) 108 (90 Base) MCG/ACT inhaler, 2 puffs, Disp: , Rfl:    cetirizine (ZYRTEC) 5 MG tablet, Take 5 mg by mouth daily., Disp: , Rfl:    EPINEPHrine 0.3 mg/0.3 mL IJ SOAJ injection, auto-injector into outer thigh, Disp: , Rfl:    fexofenadine (ALLEGRA) 180 MG tablet, 1 tablet Orally Once a day, Disp: , Rfl:    methylphenidate (RITALIN) 10 MG tablet, Take 10 mg by mouth 2 (two) times daily as needed., Disp: , Rfl:   Current Facility-Administered Medications:    triamcinolone  acetonide (KENALOG -40) injection 40 mg, 40 mg, Intramuscular, Once, Carl, Thomas J, MD   Objective:     There were no vitals filed for this visit.    There is no height or weight on file to calculate BMI.    Physical Exam:    ***   Electronically signed by:  [name] 7:53 AM 01/10/24

## 2024-01-11 ENCOUNTER — Ambulatory Visit: Admitting: Sports Medicine

## 2024-01-11 VITALS — BP 122/82 | HR 115 | Ht 71.0 in | Wt 170.0 lb

## 2024-01-11 DIAGNOSIS — S43014A Anterior dislocation of right humerus, initial encounter: Secondary | ICD-10-CM | POA: Diagnosis not present

## 2024-01-11 NOTE — Patient Instructions (Signed)
 In sling at all times for 3 weeks  Shoulder HEP 1 time a day  3 week follow up

## 2024-02-02 NOTE — Progress Notes (Unsigned)
    Ben Jackson [facility] [address] [place] [address] Phone: [phone]   Assessment and Plan:     There are no diagnoses linked to this encounter.  ***   Pertinent previous records reviewed include ***    Follow Up: ***     Subjective:   I, Carl [name], am serving as a Neurosurgeon for Doctor Ulysees Gander   Chief Complaint: right shoulder pain    HPI:    01/11/2024 Patient is a 23 year old male with right shoulder pain. Patient states he was seen in ED 01/05/2024 he was training trying to hold a ladder when he felt his shoulder dislocate. Patient denies any falls or other trauma to the area. No history of shoulder dislocation previously. He starts a new job on Monday. Fist dislocation. No meds. Does get numbness and tingling down to his hand and fingers. He is in sling. ROM is good and WNL.    02/03/2024 Patient states   Relevant Historical Information: None pertinent  Additional pertinent review of systems negative.   Current Outpatient Medications:    albuterol (VENTOLIN HFA) 108 (90 Base) MCG/ACT inhaler, 2 puffs, Disp: , Rfl:    cetirizine (ZYRTEC) 5 MG tablet, Take 5 mg by mouth daily., Disp: , Rfl:    EPINEPHrine 0.3 mg/0.3 mL IJ SOAJ injection, auto-injector into outer thigh, Disp: , Rfl:    fexofenadine (ALLEGRA) 180 MG tablet, 1 tablet Orally Once a day, Disp: , Rfl:    methylphenidate (RITALIN) 10 MG tablet, Take 10 mg by mouth 2 (two) times daily as needed., Disp: , Rfl:   Current Facility-Administered Medications:    triamcinolone  acetonide (KENALOG -40) injection 40 mg, 40 mg, Intramuscular, Once, Thekkekandam, Thomas J, MD   Objective:     There were no vitals filed for this visit.    There is no height or weight on file to calculate BMI.    Physical Exam:    ***   Electronically signed by:  [name] D[facility] 7:44 AM 02/02/24

## 2024-02-03 ENCOUNTER — Ambulatory Visit: Admitting: Sports Medicine

## 2024-02-03 VITALS — HR 98 | Ht 71.0 in | Wt 168.0 lb

## 2024-02-03 DIAGNOSIS — S43014D Anterior dislocation of right humerus, subsequent encounter: Secondary | ICD-10-CM | POA: Diagnosis not present

## 2024-02-03 NOTE — Patient Instructions (Signed)
 Work note provided limit over head activities. And no lifting more than 15 pounds until  PT referral  Shoulder HEP stop if any discomfort  Gradually discontinue sling use as tolerated 6 week follow up

## 2024-02-11 ENCOUNTER — Ambulatory Visit: Attending: Sports Medicine

## 2024-02-11 ENCOUNTER — Other Ambulatory Visit: Payer: Self-pay

## 2024-02-11 DIAGNOSIS — M5413 Radiculopathy, cervicothoracic region: Secondary | ICD-10-CM | POA: Insufficient documentation

## 2024-02-11 DIAGNOSIS — S43004S Unspecified dislocation of right shoulder joint, sequela: Secondary | ICD-10-CM | POA: Insufficient documentation

## 2024-02-11 DIAGNOSIS — M25811 Other specified joint disorders, right shoulder: Secondary | ICD-10-CM | POA: Diagnosis present

## 2024-02-11 DIAGNOSIS — S43014D Anterior dislocation of right humerus, subsequent encounter: Secondary | ICD-10-CM | POA: Insufficient documentation

## 2024-02-11 NOTE — Therapy (Signed)
 OUTPATIENT PHYSICAL THERAPY SHOULDER EVALUATION   Patient Name: Carl [name] MRN: [id number] DOB:2001/03/22, 23 y.o., male Today's Date: 02/11/2024  END OF SESSION:  PT End of Session - 02/11/24 1032     Visit Number 1    Number of Visits 5    Date for PT Re-Evaluation 03/10/24    Authorization Type [employer]    PT Start Time 1030    PT Stop Time 1122    PT Time Calculation (min) 52 min    Activity Tolerance Patient tolerated treatment well    Behavior During Therapy [facility] for tasks assessed/performed             Past Medical History:  Diagnosis Date   Allergic rhinitis    Asthma    Past Surgical History:  Procedure Laterality Date   ANTERIOR CRUCIATE LIGAMENT REPAIR Left    Patient Active Problem List   Diagnosis Date Noted   Hamstring tendinitis of right thigh 10/26/2023   Elevated LDL cholesterol level 05/24/2023   Chronic right shoulder pain 11/09/2022   Mild intermittent asthma 05/20/2022   Seafood allergy 05/20/2022   Allergic rhinitis 05/20/2022   Atopic dermatitis 05/20/2022   Preventative health care 05/20/2022   Iron deficiency 10/23/2018   Nonallopathic lesion of lumbosacral region 12/07/2017   Nonallopathic lesion of sacral region 12/07/2017   Nonallopathic lesion of thoracic region 12/07/2017   Pes cavus of left foot 09/16/2017   Abnormality of gait 06/01/2017   Chronic pain of left knee 03/29/2017    PCP: Dorothe Gaster, NP  REFERRING PROVIDER: Ulysees Gander, DO  REFERRING DIAG:  [phone] (ICD-10-CM) - Anterior dislocation of right shoulder, subsequent encounter    THERAPY DIAG:  Radiculopathy, cervicothoracic region  Other specified joint disorders, right shoulder  Shoulder dislocation, right, sequela  Rationale for Evaluation and Treatment: Rehabilitation  ONSET DATE: 01/05/2024  SUBJECTIVE:                                                                                                                                                                                       SUBJECTIVE STATEMENT:  Patient reports to PT about 5 weeks s/p right shoulder dislocation.  Relocation procedure was successfully performed in ED same day.  He wore sling for 3 weeks, and has been without out sling for 1 week at this time.  He reports that he has no significant pain in his shoulder, has been working on shoulder strengthening exercises from a friend who had a previous dislocation.  He does not recall receiving exercises from referring provider, but remains active.  He has less frequent paresthesia in the right upper extremity, but does note some ongoing  tenderness near right elbow.  He states that initially his injury happened while raising something over his head.  However he does recall about a year ago having an injury while snowboarding when his shoulder was "hyperextending".  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  He started a new job a few weeks ago, is currently in training and has not returned to full duties at this time.  Patient is interested in clarifying current restrictions.   Hand dominance: Right  PERTINENT HISTORY: No significant past medical history related to right shoulder injury.  PAIN:  Are you having pain? No  PRECAUTIONS: None  RED FLAGS: None   WEIGHT BEARING RESTRICTIONS: No  FALLS:  Has patient fallen in last 6 months? No  LIVING ENVIRONMENT: Lives with: lives with their family Lives in: House/apartment  OCCUPATION: Firefighter   PLOF: Independent and Vocation/Vocational requirements: 100lb ladder lifting, carrying, throwing, dragging people  PATIENT GOALS: Patient would like to get to a place where he will be cleared to go back to work.   NEXT MD VISIT: 03/12/2024  OBJECTIVE:  Note: Objective measures were completed at Evaluation unless otherwise noted.  DIAGNOSTIC FINDINGS:  01/05/2024 FINDINGS: Interval  reduction of the previous anterior shoulder dislocation. No underlying residual fracture or dislocation. Preserved joint spaces and bone mineralization. Imaging was obtained to aid in treatment.   IMPRESSION: Postreduction  PATIENT SURVEYS:  Quick Dash 2.27  COGNITION: Overall cognitive status: Within functional limits for tasks assessed     SENSATION: Not tested  POSTURE: Mild rounded shoulder, forward head   UPPER EXTREMITY ROM: Grossly WNL BIL   UPPER EXTREMITY MMT:   MMT Right eval Left eval  Shoulder flexion 4+ 4+  Shoulder extension    Shoulder abduction 4+ 4+  Shoulder adduction    Shoulder internal rotation 5 5  Shoulder external rotation 5 5  Elbow flexion 5 5  Elbow extension 5 5  Wrist flexion 5 5  Wrist extension 5 5  Wrist ulnar deviation    Wrist radial deviation    Wrist pronation    Grip strength -- avg of 3 trials  120lb 100lb  (Blank rows = not tested)  JOINT MOBILITY TESTING:  Deferred for future visit as indicated                                                                                                                              TREATMENT DATE:   [facility] Adult PT Treatment:                                                DATE: 02/11/2024  Initial evaluation: see patient education and home exercise program as noted below   Shoulder Flexion Serratus Activation with Resistance x 5  Body Blade, x 30 sec each  90 degrees of shoulder flexion, palm down  90 degrees of shoulder abduction, palm down  Overhead punch  Repeated arc from 90 degree shoulder flexion to shoulder abduction  Repeated arc from 90 degree shoulder flexion to overhead  Sled Pushing/pulling 1 lap with 50lb, 75lb, 100lb -- patient able to perform without exacerbation of symptoms    PATIENT EDUCATION: Education details: reviewed initial home exercise program; discussion of POC, prognosis and goals for skilled PT  Discussed current lifting restrictions,  indications/contraindications for progression of resistance activities  Person educated: Patient Education method: Explanation, Demonstration, and Handouts Education comprehension: verbalized understanding, returned demonstration, and needs further education  HOME EXERCISE PROGRAM: Access Code: [email or web address] URL: [email or web address] Date: 02/11/2024 Prepared by: Arlester Bence  Exercises - Shoulder Flexion Serratus Activation with Resistance  - 1 x daily - 7 x weekly - 2 sets - 10 reps - Shoulder External Rotation Reactive Isometrics  - 1 x daily - 7 x weekly - 2 sets - 10 reps - Shoulder Internal Rotation Reactive Isometrics  - 1 x daily - 7 x weekly - 2 sets - 10 reps - Standing Shoulder Flexion Reactive Isometric  - 1 x daily - 7 x weekly - 2 sets - 10 reps - Standing Shoulder Row Reactive Isometric  - 1 x daily - 7 x weekly - 2 sets - 10 reps  ASSESSMENT:  CLINICAL IMPRESSION: Dominic is a 23 y.o. male who was seen today for physical therapy evaluation and treatment for right shoulder strength deficits related to recent shoulder dislocation/relocation. He is demonstrating decreased right shoulder flexion and abduction MMT scores, decreased postural endurance, decreased tolerance of overhead activities.  He requires skilled PT services at this time to address relevant deficits and return to [facility] including occupational requirements.   OBJECTIVE IMPAIRMENTS: decreased strength, impaired UE functional use, improper body mechanics, postural dysfunction, and pain.   ACTIVITY LIMITATIONS: carrying and lifting  PARTICIPATION LIMITATIONS: occupation  PERSONAL FACTORS: Profession are also affecting patient's functional outcome.   REHAB POTENTIAL: Excellent  CLINICAL DECISION MAKING: Stable/uncomplicated  EVALUATION COMPLEXITY: Low   GOALS: Goals reviewed with patient? YES  SHORT TERM GOALS = LONG TERM GOALS Target date: 03/10/2024    Patient will be independent with  initial home program at least 3 days/week.  Baseline: provided at eval Goal Status: INITIAL   2.  Patient will demonstrate improved postural awareness throughout duration of treatment session while seated without need for cueing from PT.  Baseline: see objective measures Goal Status: INITIAL   3.  Patient will demonstrate 5/5 R shoulder MMT scores with resisted testing.  Baseline:4+/5 shoulder flexion and shoulder abduction   Goal status: INITIAL  4.  Patient will demonstrate ability to tolerate overhead throwing and catching of at least 1kg ball x 20 without onset of pain or paresthesia.  Baseline: unable  Goal status: INITIAL  4.  Patient will demonstrate ability to tolerate quick velocity ovrehad lifting of at least 30lb x 20 without onset of pain or paresthesia.  Baseline: unable  Goal status: INITIAL    PLAN:  PT FREQUENCY: 1x/week  PT DURATION: 4 weeks  PLANNED INTERVENTIONS: 16109- PT Re-evaluation, 97750- Physical Performance Testing, 97110-Therapeutic exercises, 97530- Therapeutic activity, [id number]- Neuromuscular re-education, 97535- Self Care, [id number]- Manual therapy, [id number]- Electrical stimulation (unattended), Patient/Family education, Taping, Cryotherapy, and Moist heat  PLAN FOR NEXT SESSION: Progression of R shoulder stability training (focused on end-range stability), R shoulder flexion/abduction strengthening, ongoing patient education to maximize  independence with exercise progression following d/c from PT    Arlester Bence, PT, DPT  02/11/2024 3:57 PM

## 2024-02-23 ENCOUNTER — Ambulatory Visit

## 2024-02-23 DIAGNOSIS — S43004S Unspecified dislocation of right shoulder joint, sequela: Secondary | ICD-10-CM

## 2024-02-23 DIAGNOSIS — M5413 Radiculopathy, cervicothoracic region: Secondary | ICD-10-CM

## 2024-02-23 DIAGNOSIS — M25811 Other specified joint disorders, right shoulder: Secondary | ICD-10-CM

## 2024-02-23 NOTE — Therapy (Signed)
 OUTPATIENT PHYSICAL THERAPY NOTE   Patient Name: Carl [name] MRN: [id number] DOB:01/27/2001, 23 y.o., male Today's Date: 02/23/2024  END OF SESSION:   PT End of Session - 02/23/24 1747     Visit Number 2    Number of Visits 5    Date for PT Re-Evaluation 03/10/24    Authorization Type [employer]    PT Start Time 1745    PT Stop Time 1820    PT Time Calculation (min) 35 min    Activity Tolerance Patient tolerated treatment well    Behavior During Therapy [facility] for tasks assessed/performed           Past Medical History:  Diagnosis Date   Allergic rhinitis    Asthma    Past Surgical History:  Procedure Laterality Date   ANTERIOR CRUCIATE LIGAMENT REPAIR Left    Patient Active Problem List   Diagnosis Date Noted   Hamstring tendinitis of right thigh 10/26/2023   Elevated LDL cholesterol level 05/24/2023   Chronic right shoulder pain 11/09/2022   Mild intermittent asthma 05/20/2022   Seafood allergy 05/20/2022   Allergic rhinitis 05/20/2022   Atopic dermatitis 05/20/2022   Preventative health care 05/20/2022   Iron deficiency 10/23/2018   Nonallopathic lesion of lumbosacral region 12/07/2017   Nonallopathic lesion of sacral region 12/07/2017   Nonallopathic lesion of thoracic region 12/07/2017   Pes cavus of left foot 09/16/2017   Abnormality of gait 06/01/2017   Chronic pain of left knee 03/29/2017    PCP: Wendee Lynwood HERO, NP  REFERRING PROVIDER: Leonce Katz, DO  REFERRING DIAG:  [phone] (ICD-10-CM) - Anterior dislocation of right shoulder, subsequent encounter    THERAPY DIAG:  Radiculopathy, cervicothoracic region  Other specified joint disorders, right shoulder  Shoulder dislocation, right, sequela  Rationale for Evaluation and Treatment: Rehabilitation  ONSET DATE: 01/05/2024  SUBJECTIVE:                                                                                                                                                                                       SUBJECTIVE STATEMENT:  02/23/2024 Patient reporting no new pain or instances of dislocation since last visit. He states that he has been progressing with work activities, including lifting ladder overhead, without adverse events.   Eval: Patient reports to PT about 5 weeks s/p right shoulder dislocation.  Relocation procedure was successfully performed in ED same day.  He wore sling for 3 weeks, and has been without out sling for 1 week at this time.  He reports that he has no significant pain in his shoulder, has been working on shoulder strengthening exercises from a friend who had a  previous dislocation.  He does not recall receiving exercises from referring provider, but remains active.  He has less frequent paresthesia in the right upper extremity, but does note some ongoing tenderness near right elbow.  He states that initially his injury happened while raising something over his head.  However he does recall about a year ago having an injury while snowboarding when his shoulder was hyperextending.  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  He started a new job a few weeks ago, is currently in training and has not returned to full duties at this time.  Patient is interested in clarifying current restrictions.   Hand dominance: Right  PERTINENT HISTORY: No significant past medical history related to right shoulder injury.  PAIN:  Are you having pain? No  PRECAUTIONS: None  RED FLAGS: None   WEIGHT BEARING RESTRICTIONS: No  FALLS:  Has patient fallen in last 6 months? 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   NEXT MD VISIT: 03/12/2024  OBJECTIVE:  Note: Objective measures were completed at Evaluation unless otherwise noted.  DIAGNOSTIC FINDINGS:  01/05/2024 FINDINGS: Interval reduction of the previous anterior shoulder dislocation. No underlying residual fracture or dislocation. Preserved joint spaces and bone mineralization. Imaging was obtained to aid in treatment.   IMPRESSION: Postreduction  PATIENT SURVEYS:  Quick Dash 2.27  COGNITION: Overall cognitive status: Within functional limits for tasks assessed     SENSATION: Not tested  POSTURE: Mild rounded shoulder, forward head   UPPER EXTREMITY ROM: Grossly WNL BIL   UPPER EXTREMITY MMT:   MMT Right eval Left eval  Shoulder flexion 4+ 4+  Shoulder extension    Shoulder abduction 4+ 4+  Shoulder adduction    Shoulder internal rotation 5 5  Shoulder external rotation 5 5  Elbow flexion 5 5  Elbow extension 5 5  Wrist flexion 5 5  Wrist extension 5 5  Wrist ulnar deviation    Wrist radial deviation    Wrist pronation    Grip strength -- avg of 3 trials  120lb 100lb  (Blank rows = not tested)  JOINT MOBILITY TESTING:  Deferred for future visit as indicated                                                                                                                              TREATMENT DATE:    [facility] Adult PT Treatment:  DATE: 02/23/2024  UBE level 3, 2 min forward, 2 min backward   Shoulder Flexion Serratus Activation with Resistance, 3 x 8 green TB  D1 flexion/extension 2 x 15 each with blue TB  D2 flexion/extension 2 x 15 each with blue TB  Ball toss to rebounder 1kg (red) ball 3 x 10 rep  Body Blade, 2 x 30 sec each  90 degrees of shoulder flexion, palm down  90 degrees of shoulder abduction, palm down  Overhead punch  Repeated arc from 90 degree shoulder flexion to shoulder abduction  Repeated arc from 90 degree shoulder  flexion to overhead  D2 extenion-to-flexion  Patient education - encouraged to continue with gradual progression of strengthening exercises including push ups and lat pull downs      [facility] Adult PT Treatment:                                                DATE: 02/11/2024  Initial evaluation: see patient education and home exercise program as noted below   Shoulder Flexion Serratus Activation with Resistance x 5   Body Blade, x 30 sec each  90 degrees of shoulder flexion, palm down  90 degrees of shoulder abduction, palm down  Overhead punch  Repeated arc from 90 degree shoulder flexion to shoulder abduction  Repeated arc from 90 degree shoulder flexion to overhead  Sled Pushing/pulling 1 lap with 50lb, 75lb, 100lb -- patient able to perform without exacerbation of symptoms    PATIENT EDUCATION: Education details: reviewed initial home exercise program; discussion of POC, prognosis and goals for skilled PT  Discussed current lifting restrictions, indications/contraindications for progression of resistance activities  Person educated: Patient Education method: Explanation, Demonstration, and Handouts Education comprehension: verbalized understanding, returned demonstration, and needs further education  HOME EXERCISE PROGRAM: Access Code: [email or web address] URL: [email or web address] Date: 02/11/2024 Prepared by: Marko Molt  Exercises - Shoulder Flexion Serratus Activation with Resistance  - 1 x daily - 7 x weekly - 2 sets - 10 reps - Shoulder External Rotation Reactive Isometrics  - 1 x daily - 7 x weekly - 2 sets - 10 reps - Shoulder Internal Rotation Reactive Isometrics  - 1 x daily - 7 x weekly - 2 sets - 10 reps - Standing Shoulder Flexion Reactive Isometric  - 1 x daily - 7 x weekly - 2 sets - 10 reps - Standing Shoulder Row Reactive Isometric  - 1 x daily - 7 x weekly - 2 sets - 10 reps  ASSESSMENT:  CLINICAL IMPRESSION:  02/23/2024 Prentice had good tolerance  of initial treatment session. We were able to progress stabilization activities through PNF patterns and overhead motions to improve tolerance of occupational tass. Patient had will benefit from ongoing progression of resistance and dynamic activities at remaining visits to ensure proper mechanics with higher-level activities. Patient requires ongoing skilled PT intervention to address current impairments and related functional deficits. We will continue to progress as tolerated.    Eval: Arrin is a 23 y.o. male who was seen today for physical therapy evaluation and treatment for right shoulder strength deficits related to recent shoulder dislocation/relocation. He is demonstrating decreased right shoulder flexion and abduction MMT scores, decreased postural endurance, decreased tolerance of overhead activities.  He requires skilled PT services at this time to address relevant deficits and return to [facility] including occupational requirements.  OBJECTIVE IMPAIRMENTS: decreased strength, impaired UE functional use, improper body mechanics, postural dysfunction, and pain.   ACTIVITY LIMITATIONS: carrying and lifting  PARTICIPATION LIMITATIONS: occupation  PERSONAL FACTORS: Profession are also affecting patient's functional outcome.   REHAB POTENTIAL: Excellent  CLINICAL DECISION MAKING: Stable/uncomplicated  EVALUATION COMPLEXITY: Low   GOALS: Goals reviewed with patient? YES  SHORT TERM GOALS = LONG TERM GOALS Target date: 03/10/2024    Patient will be independent with initial home program at least 3 days/week.  Baseline: provided at eval Goal Status: INITIAL   2.  Patient will demonstrate improved postural awareness throughout duration of treatment session while seated without need for cueing from PT.  Baseline: see objective measures Goal Status: INITIAL   3.  Patient will demonstrate 5/5 R shoulder MMT scores with resisted testing.  Baseline:4+/5 shoulder flexion and shoulder  abduction   Goal status: INITIAL  4.  Patient will demonstrate ability to tolerate overhead throwing and catching of at least 1kg ball x 20 without onset of pain or paresthesia.  Baseline: unable  Goal status: INITIAL  4.  Patient will demonstrate ability to tolerate quick velocity ovrehad lifting of at least 30lb x 20 without onset of pain or paresthesia.  Baseline: unable  Goal status: INITIAL    PLAN:  PT FREQUENCY: 1x/week  PT DURATION: 4 weeks  PLANNED INTERVENTIONS: 02835- PT Re-evaluation, 97750- Physical Performance Testing, 97110-Therapeutic exercises, 97530- Therapeutic activity, [id number]- Neuromuscular re-education, 97535- Self Care, [id number]- Manual therapy, G0283- Electrical stimulation (unattended), Patient/Family education, Taping, Cryotherapy, and Moist heat  PLAN FOR NEXT SESSION: Progression of R shoulder stability training (focused on end-range stability), R shoulder flexion/abduction strengthening, ongoing patient education to maximize independence with exercise progression following d/c from PT    Marko Molt, PT, DPT  02/23/2024 5:44 PM

## 2024-02-29 ENCOUNTER — Ambulatory Visit: Admitting: Physical Therapy

## 2024-02-29 ENCOUNTER — Encounter: Payer: Self-pay | Admitting: Physical Therapy

## 2024-02-29 DIAGNOSIS — M25811 Other specified joint disorders, right shoulder: Secondary | ICD-10-CM

## 2024-02-29 DIAGNOSIS — M5413 Radiculopathy, cervicothoracic region: Secondary | ICD-10-CM | POA: Diagnosis not present

## 2024-02-29 DIAGNOSIS — S43004S Unspecified dislocation of right shoulder joint, sequela: Secondary | ICD-10-CM

## 2024-02-29 NOTE — Therapy (Signed)
 OUTPATIENT PHYSICAL THERAPY NOTE   Patient Name: Carl [name] MRN: [id number] DOB:2001/04/22, 23 y.o., male Today's Date: 02/29/2024  END OF SESSION:  PT End of Session - 02/29/24 1815     Visit Number 3    Number of Visits 5    Date for PT Re-Evaluation 03/10/24    PT Start Time 1745    PT Stop Time 1815    PT Time Calculation (min) 30 min    Activity Tolerance Patient tolerated treatment well    Behavior During Therapy [facility] for tasks assessed/performed          PT End of Session - 02/29/24 1815     Visit Number 3    Number of Visits 5    Date for PT Re-Evaluation 03/10/24    PT Start Time 1745    PT Stop Time 1815    PT Time Calculation (min) 30 min    Activity Tolerance Patient tolerated treatment well    Behavior During Therapy [facility] for tasks assessed/performed            Past Medical History:  Diagnosis Date   Allergic rhinitis    Asthma    Past Surgical History:  Procedure Laterality Date   ANTERIOR CRUCIATE LIGAMENT REPAIR Left    Patient Active Problem List   Diagnosis Date Noted   Hamstring tendinitis of right thigh 10/26/2023   Elevated LDL cholesterol level 05/24/2023   Chronic right shoulder pain 11/09/2022   Mild intermittent asthma 05/20/2022   Seafood allergy 05/20/2022   Allergic rhinitis 05/20/2022   Atopic dermatitis 05/20/2022   Preventative health care 05/20/2022   Iron deficiency 10/23/2018   Nonallopathic lesion of lumbosacral region 12/07/2017   Nonallopathic lesion of sacral region 12/07/2017   Nonallopathic lesion of thoracic region 12/07/2017   Pes cavus of left foot 09/16/2017   Abnormality of gait 06/01/2017   Chronic pain of left knee 03/29/2017    PCP: Wendee Lynwood HERO, NP  REFERRING PROVIDER: Leonce Katz, DO  REFERRING DIAG:  [phone] (ICD-10-CM) - Anterior dislocation of right shoulder, subsequent encounter    THERAPY DIAG:  Radiculopathy, cervicothoracic region  Other specified joint disorders, right  shoulder  Shoulder dislocation, right, sequela  Rationale for Evaluation and Treatment: Rehabilitation  ONSET DATE: 01/05/2024  SUBJECTIVE:                                                                                                                                                                                      SUBJECTIVE STATEMENT: Pt attended today's session with reports of 0/10 pain. Pt stated that they have maintained great compliance with current HEP.  Has no issues as  of now   02/29/2024 Patient reporting no new pain or instances of dislocation since last visit. He states that he has been progressing with work activities, including lifting ladder overhead, without adverse events.   Eval: Patient reports to PT about 5 weeks s/p right shoulder dislocation.  Relocation procedure was successfully performed in ED same day.  He wore sling for 3 weeks, and has been without out sling for 1 week at this time.  He reports that he has no significant pain in his shoulder, has been working on shoulder strengthening exercises from a friend who had a previous dislocation.  He does not recall receiving exercises from referring provider, but remains active.  He has less frequent paresthesia in the right upper extremity, but does note some ongoing tenderness near right elbow.  He states that initially his injury happened while raising something over his head.  However he does recall about a year ago having an injury while snowboarding when his shoulder was hyperextending.  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  He started a new job a few weeks ago, is currently in training and has not returned to full duties at this time.  Patient is interested in clarifying current restrictions.   Hand dominance: Right  PERTINENT HISTORY: No significant past medical history related to right shoulder injury.  PAIN:  Are  you having pain? No  PRECAUTIONS: None  RED FLAGS: None   WEIGHT BEARING RESTRICTIONS: No  FALLS:  Has patient fallen in last 6 months? No  LIVING ENVIRONMENT: Lives with: lives with their family Lives in: House/apartment  OCCUPATION: Firefighter   PLOF: Independent and Vocation/Vocational requirements: 100lb ladder lifting, carrying, throwing, dragging people  PATIENT GOALS: Patient would like to get to a place where he will be cleared to go back to work.   NEXT MD VISIT: 03/12/2024  OBJECTIVE:  Note: Objective measures were completed at Evaluation unless otherwise noted.  DIAGNOSTIC FINDINGS:  01/05/2024 FINDINGS: Interval reduction of the previous anterior shoulder dislocation. No underlying residual fracture or dislocation. Preserved joint spaces and bone mineralization. Imaging was obtained to aid in treatment.   IMPRESSION: Postreduction  PATIENT SURVEYS:  Quick Dash 2.27  COGNITION: Overall cognitive status: Within functional limits for tasks assessed     SENSATION: Not tested  POSTURE: Mild rounded shoulder, forward head   UPPER EXTREMITY ROM: Grossly WNL BIL   UPPER EXTREMITY MMT:   MMT Right eval Left eval  Shoulder flexion 4+ 4+  Shoulder extension    Shoulder abduction 4+ 4+  Shoulder adduction    Shoulder internal rotation 5 5  Shoulder external rotation 5 5  Elbow flexion 5 5  Elbow extension 5 5  Wrist flexion 5 5  Wrist extension 5 5  Wrist ulnar deviation    Wrist radial deviation    Wrist pronation    Grip strength -- avg of 3 trials  120lb 100lb  (Blank rows = not tested)  JOINT MOBILITY TESTING:  Deferred for future visit as indicated  TREATMENT DATE:    [facility] Adult PT Treatment:                                                DATE: 02/23/2024  UBE level 3, 2 min forward, 2 min backward   Shoulder  Flexion Serratus Activation with Resistance, 3 x 8 green TB  D1 flexion/extension 2 x 15 each with blue TB  D2 flexion/extension 2 x 15 each with blue TB  Ball toss to rebounder 1kg (red) ball 3 x 10 rep  Body Blade, 2 x 30 sec each  90 degrees of shoulder flexion, palm down  90 degrees of shoulder abduction, palm down  Overhead punch  Repeated arc from 90 degree shoulder flexion to shoulder abduction  Repeated arc from 90 degree shoulder flexion to overhead  D2 extenion-to-flexion  Patient education - encouraged to continue with gradual progression of strengthening exercises including push ups and lat pull downs    [facility] Adult PT Treatment:                                                DATE: 02/29/2024  Therapeutic Activity: Lat pull down Pulls ups Ring chin ups Bosu push ups with protraction at top of movement Re-evaluative measures   [facility] Adult PT Treatment:                                                DATE: 02/11/2024  Initial evaluation: see patient education and home exercise program as noted below   Shoulder Flexion Serratus Activation with Resistance x 5   Body Blade, x 30 sec each  90 degrees of shoulder flexion, palm down  90 degrees of shoulder abduction, palm down  Overhead punch  Repeated arc from 90 degree shoulder flexion to shoulder abduction  Repeated arc from 90 degree shoulder flexion to overhead  Sled Pushing/pulling 1 lap with 50lb, 75lb, 100lb -- patient able to perform without exacerbation of symptoms    PATIENT EDUCATION: Education details: reviewed initial home exercise program; discussion of POC, prognosis and goals for skilled PT  Discussed current lifting restrictions, indications/contraindications for progression of resistance activities  Person educated: Patient Education method: Explanation, Demonstration, and Handouts Education comprehension: verbalized understanding, returned demonstration, and needs further education  HOME EXERCISE  PROGRAM: Access Code: [email or web address] URL: [email or web address] Date: 02/11/2024 Prepared by: Marko Molt  Exercises - Shoulder Flexion Serratus Activation with Resistance  - 1 x daily - 7 x weekly - 2 sets - 10 reps - Shoulder External Rotation Reactive Isometrics  - 1 x daily - 7 x weekly - 2 sets - 10 reps - Shoulder Internal Rotation Reactive Isometrics  - 1 x daily - 7 x weekly - 2 sets - 10 reps - Standing Shoulder Flexion Reactive Isometric  - 1 x daily - 7 x weekly - 2 sets - 10 reps - Standing Shoulder Row Reactive Isometric  - 1 x daily - 7 x weekly - 2 sets - 10 reps  ASSESSMENT:  CLINICAL IMPRESSION: Pt attended physical therapy session for continuation of treatment regarding. Today's treatment  focused on improvement of  R shoulder stability and re-assessment of higher level function. Pt showed great tolerance to administered treatment with no adverse effects by the end of session. Skilled intervention was utilized via activity modification for pt tolerance with task completion, functional progression/regression promoting best outcomes inline with current rehab goals, as well as minimal verbal/tactile cuing alongside no physical assistance for safe and appropriate performance of today's activities. Pt has met all goals, reports reaching beyond PLOF and is appropriate for d/c.   Eval: Enzio is a 23 y.o. male who was seen today for physical therapy evaluation and treatment for right shoulder strength deficits related to recent shoulder dislocation/relocation. He is demonstrating decreased right shoulder flexion and abduction MMT scores, decreased postural endurance, decreased tolerance of overhead activities.  He requires skilled PT services at this time to address relevant deficits and return to [facility] including occupational requirements.   OBJECTIVE IMPAIRMENTS: decreased strength, impaired UE functional use, improper body mechanics, postural dysfunction, and pain.    ACTIVITY LIMITATIONS: carrying and lifting  PARTICIPATION LIMITATIONS: occupation  PERSONAL FACTORS: Profession are also affecting patient's functional outcome.   REHAB POTENTIAL: Excellent  CLINICAL DECISION MAKING: Stable/uncomplicated  EVALUATION COMPLEXITY: Low   GOALS: Goals reviewed with patient? YES  SHORT TERM GOALS = LONG TERM GOALS Target date: 03/10/2024    Patient will be independent with initial home program at least 3 days/week.  Baseline: provided at eval Goal Status: MET 02/29/2024   2.  Patient will demonstrate improved postural awareness throughout duration of treatment session while seated without need for cueing from PT.  Baseline: see objective measures Goal Status: MET 02/29/2024  3.  Patient will demonstrate 5/5 R shoulder MMT scores with resisted testing.  Baseline:4+/5 shoulder flexion and shoulder abduction   Goal status: MET 02/29/2024  4.  Patient will demonstrate ability to tolerate overhead throwing and catching of at least 1kg ball x 20 without onset of pain or paresthesia.  Baseline: unable  Goal status: MET 02/29/2024  4.  Patient will demonstrate ability to tolerate quick velocity ovrehad lifting of at least 30lb x 20 without onset of pain or paresthesia.  Baseline: unable  Goal status: MET 02/29/2024    PLAN:  PT FREQUENCY: 1x/week  PT DURATION: 4 weeks  PLANNED INTERVENTIONS: 97164- PT Re-evaluation, 97750- Physical Performance Testing, 97110-Therapeutic exercises, 97530- Therapeutic activity, 97112- Neuromuscular re-education, 97535- Self Care, 02859- Manual therapy, G0283- Electrical stimulation (unattended), Patient/Family education, Taping, Cryotherapy, and Moist heat  PLAN FOR NEXT SESSION:d/c  PHYSICAL THERAPY DISCHARGE SUMMARY  Visits from Start of Care: 3  Current functional level related to goals / functional outcomes: See assessment   Remaining deficits: See assessment   Education / Equipment: See assessment     Patient agrees to discharge. Patient goals were met. Patient is being discharged due to meeting the stated rehab goals.    Mabel Kiang, PT, DPT 02/29/2024, 6:16 PM

## 2024-03-05 NOTE — Progress Notes (Unsigned)
 [facility] [address] [place] [address] Phone: [phone] Subjective:   Carl [name], am serving as a scribe for Dr. Arthea Claudene.  I'Carl seeing this patient by the request  of:  Wendee Lynwood HERO, NP  CC: Back and neck pain follow-up  YEP:Dlagzrupcz  Carl [name] is a 23 y.o. male coming in with complaint of back and neck pain. OMT 12/13/2023. Also f/u for shoulder pain. Saw Dr. Leonce iN May for shoulder dislocation.  Patient is now greater than 2 months out since then.  Patient states shoulder doing well. No pain. No new symptoms.  Medications patient has been prescribed:   Taking:         Reviewed prior external information including notes and imaging from previsou exam, outside providers and external EMR if available.   As well as notes that were available from care everywhere and other healthcare systems.  Past medical history, social, surgical and family history all reviewed in electronic medical record.  No pertanent information unless stated regarding to the chief complaint.   Past Medical History:  Diagnosis Date   Allergic rhinitis    Asthma     Allergies  Allergen Reactions   Shellfish Allergy Other (See Comments)    Per allergy test     Review of Systems:  No headache, visual changes, nausea, vomiting, diarrhea, constipation, dizziness, abdominal pain, skin rash, fevers, chills, night sweats, weight loss, swollen lymph nodes, body aches, joint swelling, chest pain, shortness of breath, mood changes. POSITIVE muscle aches  Objective  Height 5' 11 (1.803 Carl).   General: No apparent distress alert and oriented x3 mood and affect normal, dressed appropriately.  HEENT: Pupils equal, extraocular movements intact  Respiratory: Patient's speak in full sentences and does not appear short of breath  Cardiovascular: No lower extremity edema, non tender, no erythema  Gait abnormality  MSK:  Back full range of motion  noted.  Some mild tightness noted with Deri on the right side.  Right knee no lateral tracking noted.  Osteopathic findings  C3 flexed rotated and side bent right C6 flexed rotated and side bent right  T3 extended rotated and side bent right inhaled rib T9 extended rotated and side bent left L2 flexed rotated and side bent right L3 flexed rotated and side bent left Sacrum right on right       Assessment and Plan:  Chronic right shoulder pain Much better after dislocation, avoid motion full-strength.  Nothing to be concerned about.  Can follow-up as needed for this.  Low back pain Mild overall, continuing to stay active.  Continue to work on posture and ergonomics, no change in medications.  Responded well to osteopathic manipulation after evaluation.  Follow-up again in 2 to 3 months if needed.  Continue to work on core strength including hip abductor strengthening    Nonallopathic problems  Decision today to treat with OMT was based on Physical Exam  After verbal consent patient was treated with HVLA, ME, FPR techniques in cervical, rib, thoracic, lumbar, and sacral  areas  Patient tolerated the procedure well with improvement in symptoms  Patient given exercises, stretches and lifestyle modifications  See medications in patient instructions if given  Patient will follow up in 4-8 weeks      The above documentation has been reviewed and is accurate and complete Carl [name] Carl [name], DO        Note: This dictation was prepared with Dragon dictation along with smaller  Lobbyist. Any transcriptional errors that result from this process are unintentional.

## 2024-03-06 ENCOUNTER — Encounter: Admitting: Physical Therapy

## 2024-03-12 ENCOUNTER — Ambulatory Visit: Admitting: Family Medicine

## 2024-03-12 ENCOUNTER — Encounter: Payer: Self-pay | Admitting: Family Medicine

## 2024-03-12 VITALS — BP 120/64 | HR 71 | Ht 71.0 in | Wt 167.0 lb

## 2024-03-12 DIAGNOSIS — M9904 Segmental and somatic dysfunction of sacral region: Secondary | ICD-10-CM

## 2024-03-12 DIAGNOSIS — M9903 Segmental and somatic dysfunction of lumbar region: Secondary | ICD-10-CM | POA: Diagnosis not present

## 2024-03-12 DIAGNOSIS — M25511 Pain in right shoulder: Secondary | ICD-10-CM

## 2024-03-12 DIAGNOSIS — M9902 Segmental and somatic dysfunction of thoracic region: Secondary | ICD-10-CM

## 2024-03-12 DIAGNOSIS — M545 Low back pain, unspecified: Secondary | ICD-10-CM

## 2024-03-12 DIAGNOSIS — G8929 Other chronic pain: Secondary | ICD-10-CM

## 2024-03-12 DIAGNOSIS — M9901 Segmental and somatic dysfunction of cervical region: Secondary | ICD-10-CM

## 2024-03-12 DIAGNOSIS — M9908 Segmental and somatic dysfunction of rib cage: Secondary | ICD-10-CM | POA: Diagnosis not present

## 2024-03-12 NOTE — Assessment & Plan Note (Signed)
 Mild overall, continuing to stay active.  Continue to work on posture and ergonomics, no change in medications.  Responded well to osteopathic manipulation after evaluation.  Follow-up again in 2 to 3 months if needed.  Continue to work on core strength including hip abductor strengthening

## 2024-03-12 NOTE — Patient Instructions (Signed)
 Good to see you Boring is good Hope you make it to the [place] See me in 2 months

## 2024-03-12 NOTE — Assessment & Plan Note (Signed)
 Much better after dislocation, avoid motion full-strength.  Nothing to be concerned about.  Can follow-up as needed for this.

## 2024-05-08 ENCOUNTER — Encounter: Payer: Self-pay | Admitting: Sports Medicine

## 2024-05-11 NOTE — Progress Notes (Deleted)
  [facility] [address] [place] [address] Phone: [phone] Subjective:    I'm seeing this patient by the request  of:  Wendee Lynwood HERO, NP  CC:   YEP:Dlagzrupcz  Carl [name] is a 23 y.o. male coming in with complaint of back and neck pain. OMT 03/12/2024. Patient states   Medications patient has been prescribed: None  Taking:         Reviewed prior external information including notes and imaging from previsou exam, outside providers and external EMR if available.   As well as notes that were available from care everywhere and other healthcare systems.  Past medical history, social, surgical and family history all reviewed in electronic medical record.  No pertanent information unless stated regarding to the chief complaint.   Past Medical History:  Diagnosis Date   Allergic rhinitis    Asthma     Allergies  Allergen Reactions   Shellfish Allergy Other (See Comments)    Per allergy test     Review of Systems:  No headache, visual changes, nausea, vomiting, diarrhea, constipation, dizziness, abdominal pain, skin rash, fevers, chills, night sweats, weight loss, swollen lymph nodes, body aches, joint swelling, chest pain, shortness of breath, mood changes. POSITIVE muscle aches  Objective  There were no vitals taken for this visit.   General: No apparent distress alert and oriented x3 mood and affect normal, dressed appropriately.  HEENT: Pupils equal, extraocular movements intact  Respiratory: Patient's speak in full sentences and does not appear short of breath  Cardiovascular: No lower extremity edema, non tender, no erythema  Gait MSK:  Back   Osteopathic findings  C2 flexed rotated and side bent right C6 flexed rotated and side bent left T3 extended rotated and side bent right inhaled rib T9 extended rotated and side bent left L2 flexed rotated and side bent right Sacrum right on right       Assessment and  Plan:  No problem-specific Assessment & Plan notes found for this encounter.    Nonallopathic problems  Decision today to treat with OMT was based on Physical Exam  After verbal consent patient was treated with HVLA, ME, FPR techniques in cervical, rib, thoracic, lumbar, and sacral  areas  Patient tolerated the procedure well with improvement in symptoms  Patient given exercises, stretches and lifestyle modifications  See medications in patient instructions if given  Patient will follow up in 4-8 weeks             Note: This dictation was prepared with Dragon dictation along with smaller phrase technology. Any transcriptional errors that result from this process are unintentional.

## 2024-05-14 ENCOUNTER — Ambulatory Visit: Admitting: Family Medicine

## 2024-05-23 ENCOUNTER — Encounter: Payer: BC Managed Care – PPO | Admitting: Nurse Practitioner
